# Patient Record
Sex: Male | Born: 1935 | Race: White | Hispanic: No | Smoking: Former smoker
Health system: Southern US, Community
[De-identification: ages and names within clinical notes are randomized; demographics above are authoritative.]

## PROBLEM LIST (undated history)

## (undated) DIAGNOSIS — G3184 Mild cognitive impairment, so stated: Secondary | ICD-10-CM

## (undated) DIAGNOSIS — E0590 Thyrotoxicosis, unspecified without thyrotoxic crisis or storm: Secondary | ICD-10-CM

## (undated) DIAGNOSIS — I10 Essential (primary) hypertension: Secondary | ICD-10-CM

## (undated) HISTORY — DX: Essential (primary) hypertension: I10

## (undated) HISTORY — DX: Mild cognitive impairment of uncertain or unknown etiology: G31.84

## (undated) HISTORY — PX: ABDOMINAL AORTIC ANEURYSM REPAIR: SUR1152

## (undated) HISTORY — DX: Thyrotoxicosis, unspecified without thyrotoxic crisis or storm: E05.90

---

## 2017-12-10 DIAGNOSIS — E0590 Thyrotoxicosis, unspecified without thyrotoxic crisis or storm: Secondary | ICD-10-CM | POA: Insufficient documentation

## 2017-12-20 ENCOUNTER — Ambulatory Visit: Payer: Medicare PPO | Admitting: Podiatry

## 2017-12-24 ENCOUNTER — Ambulatory Visit: Payer: Medicare PPO | Admitting: Podiatry

## 2017-12-24 DIAGNOSIS — M79674 Pain in right toe(s): Secondary | ICD-10-CM

## 2017-12-24 DIAGNOSIS — M79675 Pain in left toe(s): Secondary | ICD-10-CM | POA: Diagnosis not present

## 2017-12-24 DIAGNOSIS — B351 Tinea unguium: Secondary | ICD-10-CM | POA: Diagnosis not present

## 2017-12-24 NOTE — Patient Instructions (Signed)

## 2017-12-25 ENCOUNTER — Telehealth: Payer: Self-pay | Admitting: *Deleted

## 2017-12-25 NOTE — Telephone Encounter (Signed)
[facility] - Dedra SkeensGwen states she will fax the medication list to [phone].

## 2017-12-25 NOTE — Telephone Encounter (Signed)
-----   Message from Freddie Breech, DPM sent at 12/24/2017  5:00 PM EST ----- Regarding: MARS Needed on Dr. Alben Spittle ([facility]) Val,   Dr. Alben Spittle is a [facility] patient who lives at [facility] who arrived with no MARS. His daughter didn't know any information.  If we can get the MARS from [facility] or [facility] so I can have Darrick put his medications in, that would be great. I will refer him back to [facility] for Podiatry for their routine Podiatry visits. He will be eligible again starting mid-January 2020.  Thanks! Dr. Reece Agar.

## 2017-12-25 NOTE — Telephone Encounter (Signed)
[facility] - Candee Furbish states she will fax the MARs to [phone].

## 2018-01-07 ENCOUNTER — Encounter: Payer: Self-pay | Admitting: Podiatry

## 2018-01-07 NOTE — Progress Notes (Signed)
Subjective: Roger Briggs presents today with painful, thick toenails 1-5 b/l that he cannot cut himself and which interfere with daily activities.  Pain is aggravated when wearing enclosed shoe gear.  [facility] and PCP is Dr. Florentina JennyHenry Briggs. Past Medical History:  Diagnosis Date  . HTN (hypertension)   . Hyperthyroidism   . Mild cognitive impairment    Patient Active Problem List   Diagnosis Date Noted  . Hyperthyroidism 12/10/2017  History reviewed. No pertinent surgical history.  Current Outpatient Medications:  .  acetaminophen (TYLENOL) 325 MG tablet, Take 650 mg by mouth every 6 (six) hours as needed., Disp: , Rfl:  .  Betamethasone Acetate POWD, by Does not apply route., Disp: , Rfl:  .  ibuprofen (ADVIL,MOTRIN) 200 MG tablet, Take 200 mg by mouth every 6 (six) hours as needed., Disp: , Rfl:  .  Ibuprofen-diphenhydrAMINE Cit (ADVIL PM PO), Take by mouth at bedtime., Disp: , Rfl:  .  lisinopril (PRINIVIL,ZESTRIL) 40 MG tablet, Take 40 mg by mouth daily., Disp: , Rfl:  .  Metoprolol Succinate 25 MG CS24, Take 25 mg by mouth 2 (two) times daily., Disp: , Rfl:  .  metroNIDAZOLE (FLAGYL) 10 mg/mL oral suspension, Take 15 mg/kg/day by mouth 3 (three) times daily., Disp: , Rfl:  .  QUEtiapine (SEROQUEL) 12.5 mg TABS tablet, Take 12.5 mg by mouth at bedtime., Disp: , Rfl:  .  QUEtiapine (SEROQUEL) 25 MG tablet, Take 25 mg by mouth at bedtime., Disp: , Rfl:  .  sertraline (ZOLOFT) 25 MG tablet, Take 25 mg by mouth daily., Disp: , Rfl:  .  traZODone (DESYREL) 50 MG tablet, Take 50 mg by mouth at bedtime., Disp: , Rfl:  .  augmented betamethasone dipropionate (DIPROLENE-AF) 0.05 % cream, ONE application TWICE DAILY topically AS NEEDED itching/ eczema, Disp: , Rfl: 3 .  methimazole (TAPAZOLE) 10 MG tablet, Take 10 mg by mouth daily., Disp: , Rfl:  .  metoprolol tartrate (LOPRESSOR) 25 MG tablet, Take by mouth., Disp: , Rfl:    No Known Allergies   Social  History   Occupational History  . Occupation: OB Gyn    Comment: retired  Tobacco Use  . Smoking status: Current Every Day Smoker    Types: Pipe  . Smokeless tobacco: Never Used  Substance and Sexual Activity  . Alcohol use: Not Currently  . Drug use: Never  . Sexual activity: Not on file   Family History  Problem Relation Age of Onset  . Hypertension Daughter   . Depression Daughter    Objective: Vascular Examination: Capillary refill time immediate x 10 digits Dorsalis pedis and Posterior tibial pulses faintly palpable b/l Digital hair x 10 digits absent b/l Skin temperature gradient WNL b/l  Dermatological Examination: Skin with age related thinning and atrophy b/l  Toenails 1-5 b/l discolored, thick, dystrophic with subungual debris and pain with palpation to nailbeds due to thickness of nails.  Musculoskeletal: Muscle strength 5/5 to all LE muscle groups  Neurological: Sensation intact with 10 gram monofilament. Vibratory sensation intact.  Assessment: Painful onychomycosis toenails 1-5 b/l   Plan: 1. Toenails 1-5 b/l were debrided in length and girth without iatrogenic bleeding. 2. Patient to continue soft, supportive shoe gear 3. Patient to report any pedal injuries to medical professional immediately. 4. Patient/POA to call should there be a concern in the interim. 5. [facility] and I recommended Dr. Alben SpittleWeaver be added to their next scheduled Podiatry visit.

## 2018-01-08 ENCOUNTER — Telehealth: Payer: Self-pay | Admitting: Podiatry

## 2018-01-08 NOTE — Telephone Encounter (Signed)
Rodney Boozeasha with [facility] called following up on medical records request. Requesting notes be faxed to [phone] and my number is [phone].

## 2019-01-06 ENCOUNTER — Emergency Department (HOSPITAL_COMMUNITY): Payer: Medicare PPO

## 2019-01-06 ENCOUNTER — Emergency Department (HOSPITAL_COMMUNITY)
Admission: EM | Admit: 2019-01-06 | Discharge: 2019-01-06 | Disposition: A | Discharge status: Skilled Nursing Facility | Payer: Medicare PPO | Attending: Emergency Medicine | Admitting: Emergency Medicine

## 2019-01-06 ENCOUNTER — Other Ambulatory Visit: Payer: Self-pay

## 2019-01-06 ENCOUNTER — Encounter (HOSPITAL_COMMUNITY): Payer: Self-pay

## 2019-01-06 DIAGNOSIS — Z79899 Other long term (current) drug therapy: Secondary | ICD-10-CM | POA: Diagnosis not present

## 2019-01-06 DIAGNOSIS — I10 Essential (primary) hypertension: Secondary | ICD-10-CM | POA: Insufficient documentation

## 2019-01-06 DIAGNOSIS — W19XXXA Unspecified fall, initial encounter: Secondary | ICD-10-CM | POA: Insufficient documentation

## 2019-01-06 DIAGNOSIS — F17290 Nicotine dependence, other tobacco product, uncomplicated: Secondary | ICD-10-CM | POA: Diagnosis not present

## 2019-01-06 DIAGNOSIS — F0390 Unspecified dementia without behavioral disturbance: Secondary | ICD-10-CM | POA: Insufficient documentation

## 2019-01-06 DIAGNOSIS — R519 Headache, unspecified: Secondary | ICD-10-CM | POA: Diagnosis present

## 2019-01-06 NOTE — ED Triage Notes (Signed)
Patient arrived via [place] after an unwitnessed fall in the bathroom. Pt states this is the second fall today, hx of same. Patient denies pain, hx: dementia. Ambulatory with EMS. No blood thinners noted.

## 2019-01-06 NOTE — Discharge Instructions (Addendum)
We saw you in the ER after you had a fall. °All the imaging results are normal, no fractures seen. No evidence of brain bleed. °Please be very careful with walking, and do everything possible to prevent falls. ° ° °

## 2019-01-06 NOTE — ED Notes (Signed)
Called report to [employer].

## 2019-01-07 NOTE — ED Provider Notes (Signed)
[facility] DEPT Provider Note   CSN: [id number] Arrival date & time: 01/06/19  1705     History   Chief Complaint Chief Complaint  Patient presents with  . Fall    HPI Roger Briggs is a 85 y.o. male.     HPI 85 year old male comes in a chief complaint of fall  Level 5 caveat for dementia.  According [facility] had an unwitnessed fall in the bathroom.  Patient was ambulatory per EMS.  He is not on any blood thinners.  He has no complaints from his side.   Past Medical History:  Diagnosis Date  . HTN (hypertension)   . Hyperthyroidism   . Mild cognitive impairment     Patient Active Problem List   Diagnosis Date Noted  . Hyperthyroidism 12/10/2017    History reviewed. No pertinent surgical history.      Home Medications    Prior to Admission medications   Medication Sig Start Date End Date Taking? Authorizing Provider  acetaminophen (TYLENOL) 325 MG tablet Take 650 mg by mouth every 6 (six) hours as needed.   Yes [provider]  augmented betamethasone dipropionate (DIPROLENE-AF) 0.05 % cream Apply 1 application topically 2 (two) times daily as needed (itching).  12/12/17  Yes [provider]  ibuprofen (ADVIL,MOTRIN) 200 MG tablet Take 200 mg by mouth every 6 (six) hours as needed.   Yes [provider]  lisinopril (PRINIVIL,ZESTRIL) 40 MG tablet Take 40 mg by mouth daily.   Yes [provider]  LORazepam (ATIVAN) 0.5 MG tablet Take 0.5 mg by mouth daily as needed (agitation).   Yes [provider]  metoprolol tartrate (LOPRESSOR) 25 MG tablet Take 25 mg by mouth 2 (two) times daily.    Yes [provider]  traZODone (DESYREL) 50 MG tablet Take 25 mg by mouth at bedtime.    Yes [provider]    Family History Family History  Problem Relation Age of Onset  . Hypertension Daughter   . Depression Daughter     Social  History Social History   Tobacco Use  . Smoking status: Current Every Day Smoker    Types: Pipe  . Smokeless tobacco: Never Used  Substance Use Topics  . Alcohol use: Not Currently  . Drug use: Never     Allergies   Patient has no known allergies.   Review of Systems Review of Systems  Unable to perform ROS: Mental status change     Physical Exam Updated Vital Signs BP (!) 168/51 (BP Location: Right Arm)   Pulse 97   Temp 98.7 F (37.1 C) (Oral)   Resp 19   Ht 6\' 1"  (1.854 m)   Wt 86.2 kg   SpO2 100%   BMI 25.07 kg/m   Physical Exam Vitals signs and nursing note reviewed.  Constitutional:      Appearance: He is well-developed.  HENT:     Head: Atraumatic.  Neck:     Musculoskeletal: Neck supple.  Cardiovascular:     Rate and Rhythm: Normal rate.  Pulmonary:     Effort: Pulmonary effort is normal.  Musculoskeletal:        General: No deformity.  Skin:    General: Skin is warm.  Neurological:     Mental Status: He is alert and oriented to person, place, and time.      ED Treatments / Results  Labs (all labs ordered are listed, but only  abnormal results are displayed) Labs Reviewed - No data to display  EKG None  Radiology Ct Head Wo Contrast  Result Date: 01/06/2019 CLINICAL DATA:  Acute pain due to trauma. EXAM: CT HEAD WITHOUT CONTRAST CT CERVICAL SPINE WITHOUT CONTRAST TECHNIQUE: Multidetector CT imaging of the head and cervical spine was performed following the standard protocol without intravenous contrast. Multiplanar CT image reconstructions of the cervical spine were also generated. COMPARISON:  None. FINDINGS: CT HEAD FINDINGS Brain: No evidence of acute infarction, hemorrhage, hydrocephalus, extra-axial collection or mass lesion/mass effect. There is advanced age related atrophy and chronic microvascular ischemic changes. 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 Vascular: No hyperdense vessel or unexpected calcification. Skull:  Normal. Negative for fracture or focal lesion. Sinuses/Orbits: No acute finding. Other: None. CT CERVICAL SPINE FINDINGS Alignment: Normal. Skull base and vertebrae: No acute fracture. No primary bone lesion or focal pathologic process. Soft tissues and spinal canal: No prevertebral fluid or swelling. No visible canal hematoma. Disc levels: Multilevel disc height loss is noted throughout the cervical spine greatest at the C5-C6 and C6-C7 levels. Upper chest: Negative. Other: None IMPRESSION: 1. No acute intracranial abnormality. 2. Advanced age related atrophy and chronic microvascular ischemic changes of the brain. 3. No evidence for acute fracture or subluxation of the cervical spine. 4. Multilevel degenerative disc disease of the cervical spine greatest at the C5-C6 and C6-C7 levels. Electronically Signed   By: Katherine Mantlehristopher  Green M.D.   On: 01/06/2019 18:53   Ct Cervical Spine Wo Contrast  Result Date: 01/06/2019 CLINICAL DATA:  Acute pain due to trauma. EXAM: CT HEAD WITHOUT CONTRAST CT CERVICAL SPINE WITHOUT CONTRAST TECHNIQUE: Multidetector CT imaging of the head and cervical spine was performed following the standard protocol without intravenous contrast. Multiplanar CT image reconstructions of the cervical spine were also generated. COMPARISON:  None. FINDINGS: CT HEAD FINDINGS Brain: No evidence of acute infarction, hemorrhage, hydrocephalus, extra-axial collection or mass lesion/mass effect. There is advanced age related atrophy and chronic microvascular ischemic changes. 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 Vascular: No hyperdense vessel or unexpected calcification. Skull: Normal. Negative for fracture or focal lesion. Sinuses/Orbits: No acute finding. Other: None. CT CERVICAL SPINE FINDINGS Alignment: Normal. Skull base and vertebrae: No acute fracture. No primary bone lesion or focal pathologic process. Soft tissues and spinal canal: No prevertebral fluid or swelling. No visible canal  hematoma. Disc levels: Multilevel disc height loss is noted throughout the cervical spine greatest at the C5-C6 and C6-C7 levels. Upper chest: Negative. Other: None IMPRESSION: 1. No acute intracranial abnormality. 2. Advanced age related atrophy and chronic microvascular ischemic changes of the brain. 3. No evidence for acute fracture or subluxation of the cervical spine. 4. Multilevel degenerative disc disease of the cervical spine greatest at the C5-C6 and C6-C7 levels. Electronically Signed   By: Katherine Mantlehristopher  Green M.D.   On: 01/06/2019 18:53   Dg Chest Port 1 View  Result Date: 01/06/2019 CLINICAL DATA:  Fall.  Dementia. EXAM: PORTABLE CHEST 1 VIEW COMPARISON:  None. FINDINGS: Patient rotated minimally left. Moderate cardiomegaly. Prominent transverse aorta. No pleural effusion or pneumothorax. Bibasilar scarring. No congestive failure. IMPRESSION: No acute or posttraumatic deformity identified. Bibasilar scarring. Electronically Signed   By: Jeronimo GreavesKyle  Talbot M.D.   On: 01/06/2019 18:05    Procedures Procedures (including critical care time)  Medications Ordered in ED Medications - No data to display   Initial Impression / Assessment and Plan / ED Course  I have reviewed the triage vital signs  and the nursing notes.  Pertinent labs & imaging results that were available during my care of the patient were reviewed by me and considered in my medical decision making (see chart for details).        DDx includes: - Mechanical falls - ICH - Fractures - Contusions - Soft tissue injury  Patient comes in with chief complaint of fall.  CT head and C-spine ordered as we cannot clear his head and C-spine clinically because of his severe dementia.  Results of the imaging in the ED is negative.  Patient will be discharged to the facility.    Final Clinical Impressions(s) / ED Diagnoses   Final diagnoses:  Fall in home, initial encounter    ED Discharge Orders    None       Derwood Kaplan, MD 01/07/19 2338

## 2019-09-10 ENCOUNTER — Ambulatory Visit: Payer: Medicare Other | Admitting: Cardiovascular Disease

## 2019-09-10 ENCOUNTER — Telehealth: Payer: Self-pay | Admitting: Cardiovascular Disease

## 2019-09-10 ENCOUNTER — Other Ambulatory Visit: Payer: Self-pay

## 2019-09-10 ENCOUNTER — Encounter: Payer: Self-pay | Admitting: Cardiovascular Disease

## 2019-09-10 VITALS — BP 158/85 | HR 56 | Ht 73.0 in | Wt 198.6 lb

## 2019-09-10 DIAGNOSIS — I10 Essential (primary) hypertension: Secondary | ICD-10-CM

## 2019-09-10 DIAGNOSIS — R0602 Shortness of breath: Secondary | ICD-10-CM

## 2019-09-10 DIAGNOSIS — I4811 Longstanding persistent atrial fibrillation: Secondary | ICD-10-CM

## 2019-09-10 DIAGNOSIS — R001 Bradycardia, unspecified: Secondary | ICD-10-CM

## 2019-09-10 NOTE — Patient Instructions (Addendum)
Medication Instructions:  The current medical regimen is effective;  continue present plan and medications.  *If you need a refill on your cardiac medications before your next appointment, please call your pharmacy*   Lab Work: TSH, CBC, BMET, BNP today  If you have labs (blood work) drawn today and your tests are completely normal, you will receive your results only by: Marland Kitchen MyChart Message (if you have MyChart) OR . A paper copy in the mail If you have any lab test that is abnormal or we need to change your treatment, we will call you to review the results.   Testing/Procedures: Echocardiogram - Your physician has requested that you have an echocardiogram. Echocardiography is a painless test that uses sound waves to create images of your heart. It provides your doctor with information about the size and shape of your heart and how well your heart's chambers and valves are working. This procedure takes approximately one hour. There are no restrictions for this procedure. This will be performed at our [facility] location - [address].    Follow-Up: At [facility], you and your health needs are our priority.  As part of our continuing mission to provide you with exceptional heart care, we have created designated Provider Care Teams.  These Care Teams include your primary Cardiologist (physician) and Advanced Practice Providers (APPs -  Physician Assistants and Nurse Practitioners) who all work together to provide you with the care you need, when you need it.  We recommend signing up for the patient portal called "MyChart".  Sign up information is provided on this After Visit Summary.  MyChart is used to connect with patients for Virtual Visits (Telemedicine).  Patients are able to view lab/test results, encounter notes, upcoming appointments, etc.  Non-urgent messages can be sent to your provider as well.   To learn more about what you can do with MyChart, go to  [email or web address].    Your next appointment:   2 month(s)  The format for your next appointment:   In Person  Provider:   Lennie Odor, MD   Dr.O'Neal's Nurse: Roger Fanning, LPN- please have them e-mail records to me (secure e-mail) [email or web address]  Or fax records to [phone]

## 2019-09-10 NOTE — Progress Notes (Signed)
Cardiology Office Note:   Date:  09/10/2019  NAME:  Roger Briggs    MRN: [id number] DOB:  07-Nov-1929   PCP:  Patient, No Pcp Per  Cardiologist:  No primary care provider on file.   Referring MD: [facility]*   Chief Complaint  Patient presents with  . Bradycardia    History of Present Illness:   Roger Briggs is a 86 y.o. male with a hx of dementia, hyper thyroidism on methimazole, hypertension who is being seen today for the evaluation of bradycardia/atrial fibrillation at the request of [facility], Doctors [facility]*.  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  [name].  He apparently has a history of a AAA status post repair a few years ago.  He has no prior cardiac history per his report or his daughter's report.  He does suffer from dementia and frequent falls.  Apparently he had a fall in July 2018 and he suffered a subdural hematoma.  He apparently has been in assisted living since that time.  He also has a working diagnosis of vascular dementia or Alzheimer's.  He apparently still is somewhat with it.  He does require assistance with bathing and dressing.  He also has been diagnosed with recurrent pneumonia from what I can tell over the past few weeks from his daughter's report.  He did have a chest x-ray that was done last week but we do not have the results of that data.  He has been known to have a slow heart rate and apparently an EKG was checked at the facility he lives.  He was noted to be in atrial fibrillation and sent for evaluation.  He has no prior history of atrial fibrillation.  His EKG today demonstrates A. fib with heart rate of 56.  He is on metoprolol for rate control.  He is not on anticoagulation.  No prior history of heart attack or stroke.  He is not that active and requires assistance with long walks.  Despite this he is able to use a walker and denies  any chest pain or shortness of breath.  His daughter reports at times he is a little short of breath.  He is a pipe smoker.  Family history is not contributory.  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  He does have hypothyroidism but has not had his TSH checked due to the coronavirus pandemic.  Problem List 1. Hyperthyroidism on methimazole 2. Dementia 3. HTN 4. AAA s/p repair  5. Falls/Subdural hematoma   Past Medical History: Past Medical History:  Diagnosis Date  . HTN (hypertension)   . Hyperthyroidism   . Mild cognitive impairment     Past Surgical History: Past Surgical History:  Procedure Laterality Date  . ABDOMINAL AORTIC ANEURYSM REPAIR      Current Medications: Current Meds  Medication Sig  . acetaminophen (TYLENOL) 325 MG tablet Take 650 mg by mouth every 6 (six) hours as needed.  Marland Kitchen augmented betamethasone dipropionate (DIPROLENE-AF) 0.05 % cream Apply 1 application topically 2 (two) times daily as needed (itching).   Marland Kitchen doxycycline (VIBRA-TABS) 100 MG tablet Take 100 mg by mouth 2 (two) times daily.  . Emollient (CERAVE DAILY MOISTURIZING) LOTN apply to face as needed for irritation BID Externally 30 day(s)  . ibuprofen (ADVIL,MOTRIN) 200 MG tablet Take 200 mg by mouth every 6 (six) hours as  needed.  Marland Kitchen lisinopril (PRINIVIL,ZESTRIL) 40 MG tablet Take 40 mg by mouth daily.  Marland Kitchen loratadine (CLARITIN) 10 MG tablet 1 tablet Once a day Orally 30 day(s)  . LORazepam (ATIVAN) 0.5 MG tablet Take 0.5 mg by mouth daily as needed (agitation).  . methimazole (TAPAZOLE) 5 MG tablet Take 5 mg by mouth daily.  . metoprolol tartrate (LOPRESSOR) 25 MG tablet Take 25 mg by mouth 2 (two) times daily.   . traZODone (DESYREL) 50 MG tablet Take 25 mg by mouth at bedtime.      Allergies:    Patient has no known allergies.   Social History: Social History   Socioeconomic History  . Marital status: Widowed    Spouse name: Not on file  . Number of children: 5  . Years of  education: Not on file  . Highest education level: Not on file  Occupational History  . Occupation: OB Gyn    Comment: retired  Tobacco Use  . Smoking status: Current Every Day Smoker    Types: Pipe  . Smokeless tobacco: Never Used  Substance and Sexual Activity  . Alcohol use: Not Currently  . Drug use: Never  . Sexual activity: Not on file  Other Topics Concern  . Not on file  Social History Narrative  . Not on file   Social Determinants of Health   Financial Resource Strain:   . Difficulty of Paying Living Expenses:   Food Insecurity:   . Worried About Programme researcher, broadcasting/film/video in the Last Year:   . Barista in the Last Year:   Transportation Needs:   . Freight forwarder (Medical):   Marland Kitchen Lack of Transportation (Non-Medical):   Physical Activity:   . Days of Exercise per Week:   . Minutes of Exercise per Session:   Stress:   . Feeling of Stress :   Social Connections:   . Frequency of Communication with Friends and Family:   . Frequency of Social Gatherings with Friends and Family:   . Attends Religious Services:   . Active Member of Clubs or Organizations:   . Attends Banker Meetings:   Marland Kitchen Marital Status:      Family History: The patient's family history includes Depression in his daughter; Hypertension in his daughter.  ROS:   All other ROS reviewed and negative. Pertinent positives noted in the HPI.     EKGs/Labs/Other Studies Reviewed:   The following studies were personally reviewed by me today:  EKG:  EKG is ordered today.  The ekg ordered today demonstrates atrial fibrillation heart rate 56, no acute ST-T changes, no evidence for infarct, and was personally reviewed by me.   Recent Labs: No results found for requested labs within last 8760 hours.   Recent Lipid Panel No results found for: CHOL, TRIG, HDL, CHOLHDL, VLDL, LDLCALC, LDLDIRECT  Physical Exam:   VS:  BP (!) 158/85   Pulse 56   Ht 6\' 1"  (1.854 m)   Wt 198 lb 9.6 oz  (90.1 kg)   SpO2 97%   BMI 26.20 kg/m    Wt Readings from Last 3 Encounters:  09/10/19 198 lb 9.6 oz (90.1 kg)  01/06/19 190 lb (86.2 kg)    General: Well nourished, well developed, in no acute distress Heart: Atraumatic, normal size  Eyes: PEERLA, EOMI  Neck: Supple, no JVD Endocrine: No thryomegaly Cardiac: Normal S1, S2; irregular rhythm  Lungs: Crackles at the right lung base Abd: Soft, nontender, no hepatomegaly  Ext: 1+ lower extremity edema Musculoskeletal: No deformities, BUE and BLE strength normal and equal Skin: Warm and dry, no rashes   Neuro: Alert and oriented to person, place, time, and situation, CNII-XII grossly intact, no focal deficits  Psych: Normal mood and affect   ASSESSMENT:   Roger Briggs is a 86 y.o. male who presents for the following: 1. Bradycardia   2. Longstanding persistent atrial fibrillation (HCC)   3. Essential hypertension   4. SOB (shortness of breath)     PLAN:   1. Bradycardia 2. Longstanding persistent atrial fibrillation (HCC) 3. Essential hypertension 4. SOB (shortness of breath) -New onset A. fib from what I can tell.  No prior history of this. CHADSVASC=4 (age, PAD, HTN).  He is asymptomatic from this.  He is rate controlled on metoprolol.  We will continue this.  Anticoagulation is recommended however given his history of fall and subdural hematoma this could be problematic.  He also has continued falls per report of the daughter.  I did discuss the risk of bleeding associated with anticoagulation and the risk of stroke being not anticoagulated.  I have asked the daughter to discuss with the other daughters given his dementia and have them weigh in on if they would like to proceed with anticoagulation.  If he does proceed with anticoagulation we will have to have neurology's approval given the history of traumatic subdural hematoma.  It may be okay to proceed but I am reluctant to do this in this elderly gentleman with dementia who has  falls and a subdural hematoma in the past. -He reports he has been short of breath for the past few weeks and had recurrent pneumonia.  He has crackles in the right lung base.  He does have 1+ lower extremity edema.  I would like for him to get full labs today including a TSH, CBC, BMP, BNP.  I also want him to get an echocardiogram to ensure his heart structurally normal.  I hear no murmurs but we need to make sure his heart function is normal.  It is unclear what came first possible decompensated heart failure that led to A. fib or vice versa.  He also has a history of hypothyroidism and we need to make sure his thyroid state is not contributing to his atrial fibrillation.  There is no recent weight loss but they do report weight gain.  I am the concern for volume.  We will hold on Lasix for now I will follow-up the results of the BNP and echocardiogram before we proceed with further treatments.  I would also like for them to have the recent chest x-ray results sent to [place] so we can review it.  I will see him back in 2 months after the above testing. -I have asked the daughter to go ahead and begin to have goals of care discussion.  We need to set goals for how aggressive we will be.  It appears that they are focused on a less aggressive path at this point.   Disposition: Return in about 2 months (around 11/11/2019).  Medication Adjustments/Labs and Tests Ordered: Current medicines are reviewed at length with the patient today.  Concerns regarding medicines are outlined above.  Orders Placed This Encounter  Procedures  . TSH  . CBC  . Basic metabolic panel  . Brain natriuretic peptide  . ECHOCARDIOGRAM COMPLETE   No orders of the defined types were placed in this encounter.   Patient Instructions  Medication Instructions:  The current medical regimen is effective;  continue present plan and medications.  *If you need a refill on your cardiac medications before your next appointment, please  call your pharmacy*   Lab Work: TSH, CBC, BMET, BNP today  If you have labs (blood work) drawn today and your tests are completely normal, you will receive your results only by: Marland Kitchen. MyChart Message (if you have MyChart) OR . A paper copy in the mail If you have any lab test that is abnormal or we need to change your treatment, we will call you to review the results.   Testing/Procedures: Echocardiogram - Your physician has requested that you have an echocardiogram. Echocardiography is a painless test that uses sound waves to create images of your heart. It provides your doctor with information about the size and shape of your heart and how well your heart's chambers and valves are working. This procedure takes approximately one hour. There are no restrictions for this procedure. [facility] - [address], [address].    Follow-Up: At [facility], you and your health needs are our priority.  As part of our continuing mission to provide you with exceptional heart care, we have created designated Provider Care Teams.  These Care Teams include your primary Cardiologist (physician) and Advanced Practice Providers (APPs -  Physician Assistants and Nurse Practitioners) who all work together to provide you with the care you need, when you need it.  We recommend signing up for the patient portal called "MyChart".  Sign up information is provided on this After Visit Summary.  MyChart is used to connect with patients for Virtual Visits (Telemedicine).  Patients are able to view lab/test results, encounter notes, upcoming appointments, etc.  Non-urgent messages can be sent to your provider as well.   To learn more about what you can do with MyChart, go to [email or web address].    Your next appointment:   2 month(s)  The format for your next appointment:   In Person  Provider:   Lennie OdorWesley O'Neal, MD   Dr.O'Neal's Nurse: Raynelle FanningJulie, LPN- please have them e-mail  records to me (secure e-mail) [email or web address]  Or fax records to [phone]       Signed, Lenna GilfordWesley T. Flora Lipps'Neal, MD [facility]  [facility]  [address], [address] [place], [place] [address] [phone]  09/10/2019 5:08 PM

## 2019-09-10 NOTE — Telephone Encounter (Signed)
New Message  New Patient appt scheduled with Dr. Flora Lipps at 1:40 pm today with Julie's permission.

## 2019-09-11 LAB — CBC
Hematocrit: 36.1 % — ABNORMAL LOW (ref 37.5–51.0)
Hemoglobin: 11.9 g/dL — ABNORMAL LOW (ref 13.0–17.7)
MCH: 30.4 pg (ref 26.6–33.0)
MCHC: 33 g/dL (ref 31.5–35.7)
MCV: 92 fL (ref 79–97)
Platelets: 128 10*3/uL — ABNORMAL LOW (ref 150–450)
RBC: 3.91 x10E6/uL — ABNORMAL LOW (ref 4.14–5.80)
RDW: 13.2 % (ref 11.6–15.4)
WBC: 4.7 10*3/uL (ref 3.4–10.8)

## 2019-09-11 LAB — BRAIN NATRIURETIC PEPTIDE: BNP: 280.1 pg/mL — ABNORMAL HIGH (ref 0.0–100.0)

## 2019-09-11 LAB — BASIC METABOLIC PANEL
BUN/Creatinine Ratio: 19 (ref 10–24)
BUN: 23 mg/dL (ref 8–27)
CO2: 26 mmol/L (ref 20–29)
Calcium: 10.5 mg/dL — ABNORMAL HIGH (ref 8.6–10.2)
Chloride: 105 mmol/L (ref 96–106)
Creatinine, Ser: 1.19 mg/dL (ref 0.76–1.27)
GFR calc Af Amer: 63 mL/min/{1.73_m2} (ref >59)
GFR calc non Af Amer: 55 mL/min/{1.73_m2} — ABNORMAL LOW (ref >59)
Glucose: 94 mg/dL (ref 65–99)
Potassium: 4.8 mmol/L (ref 3.5–5.2)
Sodium: 143 mmol/L (ref 134–144)

## 2019-09-11 LAB — TSH: TSH: 2.71 u[IU]/mL (ref 0.450–4.500)

## 2019-09-18 ENCOUNTER — Telehealth: Payer: Self-pay | Admitting: Cardiovascular Disease

## 2019-09-18 ENCOUNTER — Other Ambulatory Visit: Payer: Self-pay

## 2019-09-18 MED ORDER — FUROSEMIDE 20 MG PO TABS
20.0000 mg | ORAL_TABLET | Freq: Every day | ORAL | 3 refills | Status: DC
Start: 2019-09-18 — End: 2019-11-06

## 2019-09-18 NOTE — Telephone Encounter (Signed)
Patient's daughter returning call for lab results. °

## 2019-09-18 NOTE — Telephone Encounter (Signed)
Called daughter back-  Gave lab results. RX sent to pharmacy.

## 2019-09-22 NOTE — Addendum Note (Signed)
Addended by: Lexie Koehl C on: 09/22/2019 11:19 AM   Modules accepted: Orders  

## 2019-09-23 ENCOUNTER — Other Ambulatory Visit: Payer: Self-pay

## 2019-09-23 ENCOUNTER — Ambulatory Visit (HOSPITAL_COMMUNITY): Payer: Medicare Other | Attending: Cardiology

## 2019-09-23 DIAGNOSIS — I4811 Longstanding persistent atrial fibrillation: Secondary | ICD-10-CM | POA: Insufficient documentation

## 2019-09-23 LAB — ECHOCARDIOGRAM COMPLETE
P 1/2 time: 496 msec
S' Lateral: 3.05 cm

## 2019-09-26 ENCOUNTER — Telehealth: Payer: Self-pay | Admitting: Cardiovascular Disease

## 2019-09-26 NOTE — Telephone Encounter (Signed)
Patient's daughter returned call for echo results.

## 2019-09-26 NOTE — Telephone Encounter (Signed)
Called patient daughter.  Gave results.  Patient daughter verbalized understanding.

## 2019-10-16 ENCOUNTER — Ambulatory Visit: Payer: Medicare Other | Admitting: Cardiology

## 2019-11-05 NOTE — Progress Notes (Signed)
Cardiology Office Note:   Date:  11/06/2019  NAME:  Roger Briggs    MRN: [id number] DOB:  [birth year]   PCP:  Pcp, No  Cardiologist:  No primary care provider on file.   Referring MD: No ref. provider found   Chief Complaint  Patient presents with   Atrial Fibrillation   History of Present Illness:   Roger Briggs is a 86 y.o. male with a hx of atrial fib, hyperthyroidism on methimazole, HTN, dementia, falls/SDH, HFpEF who presents for follow-up. Recently evaluated for atrial fibrillation. Rate controlled on metoprolol. Due to falls and SDH we did not start AC. Had some LE edema and started on lasix. BNP 280.   He reports he is doing well on Lasix.  Lower extremity edema has improved.  His metoprolol was reduced to 25 mg twice daily due to low heart rate.  No dizziness reported.  He is a walker when he is in his assisted living facility.  He is at [facility].  We did readdress the anticoagulation issue with his daughter today in the office.  She discussed this with her other family members and they have decided to hold anticoagulation this time.  Given his fall risk and subdural hematoma in the past do not want to expose him to any unnecessary bleeding risk.  I think this is reasonable.  He also has progression of his dementia.  They are working with physical therapy and speech therapy to improve his overall quality of life.  Overall things appear to be status quo.  Improvement in lower extreme edema with Lasix noted.  Problem List 1. Hyperthyroidism on methimazole 2. Dementia 3. HTN 4. AAA s/p repair  5. Falls/Subdural hematoma  6. Atrial fibrillation, persistent  7. HFpEF, 60-65% -BNP 280  Past Medical History: Past Medical History:  Diagnosis Date   HTN (hypertension)    Hyperthyroidism    Mild cognitive impairment     Past Surgical History: Past Surgical History:  Procedure Laterality Date   ABDOMINAL AORTIC ANEURYSM REPAIR      Current Medications: Current  Meds  Medication Sig   acetaminophen (TYLENOL) 325 MG tablet Take 650 mg by mouth every 6 (six) hours as needed.   augmented betamethasone dipropionate (DIPROLENE-AF) 0.05 % cream Apply 1 application topically 2 (two) times daily as needed (itching).    Emollient (CERAVE DAILY MOISTURIZING) LOTN apply to face as needed for irritation BID Externally 30 day(s)   Ferrous Fumarate (HEMOCYTE - 106 MG FE) 324 (106 Fe) MG TABS tablet Take 1 tablet by mouth daily.   furosemide (LASIX) 20 MG tablet Take 1 tablet (20 mg total) by mouth every other day.   ibuprofen (ADVIL,MOTRIN) 200 MG tablet Take 200 mg by mouth every 6 (six) hours as needed.   lisinopril (PRINIVIL,ZESTRIL) 40 MG tablet Take 40 mg by mouth daily.   loratadine (CLARITIN) 10 MG tablet 1 tablet Once a day Orally 30 day(s)   LORazepam (ATIVAN) 0.5 MG tablet Take 0.5 mg by mouth daily as needed (agitation).   methimazole (TAPAZOLE) 5 MG tablet Take 5 mg by mouth daily.   metoprolol tartrate (LOPRESSOR) 25 MG tablet Take 25 mg by mouth 2 (two) times daily.    traZODone (DESYREL) 50 MG tablet Take 25 mg by mouth at bedtime.    [DISCONTINUED] furosemide (LASIX) 20 MG tablet Take 1 tablet (20 mg total) by mouth daily.     Allergies:    Patient has no known allergies.   Social History: Social History  Socioeconomic History   Marital status: Widowed    Spouse name: Not on file   Number of children: 5   Years of education: Not on file   Highest education level: Not on file  Occupational History   Occupation: OB Gyn    Comment: retired  Tobacco Use   Smoking status: Current Every Day Smoker    Types: Pipe   Smokeless tobacco: Never Used  Substance and Sexual Activity   Alcohol use: Not Currently   Drug use: Never   Sexual activity: Not on file  Other Topics Concern   Not on file  Social History Narrative   Not on file   Social Determinants of Health   Financial Resource Strain:    Difficulty of  Paying Living Expenses: Not on file  Food Insecurity:    Worried About Programme researcher, broadcasting/film/videounning Out of Food in the Last Year: Not on file   [employer] Out of Food in the Last Year: Not on file  Transportation Needs:    Lack of Transportation (Medical): Not on file   Lack of Transportation (Non-Medical): Not on file  Physical Activity:    Days of Exercise per Week: Not on file   Minutes of Exercise per Session: Not on file  Stress:    Feeling of Stress : Not on file  Social Connections:    Frequency of Communication with Friends and Family: Not on file   Frequency of Social Gatherings with Friends and Family: Not on file   Attends Religious Services: Not on file   Active Member of Clubs or Organizations: Not on file   Attends BankerClub or Organization Meetings: Not on file   Marital Status: Not on file     Family History: The patient's family history includes Depression in his daughter; Hypertension in his daughter.  ROS:   All other ROS reviewed and negative. Pertinent positives noted in the HPI.     EKGs/Labs/Other Studies Reviewed:   The following studies were personally reviewed by me today:  TTE 09/23/2019 1. Left ventricular ejection fraction, by estimation, is 60 to 65%. The  left ventricle has normal function. The left ventricle has no regional  wall motion abnormalities. There is mild concentric left ventricular  hypertrophy. Left ventricular diastolic  parameters are indeterminate. The average left ventricular global  longitudinal strain is -21.5 %. The global longitudinal strain is normal.  2. Right ventricular systolic function is mildly reduced. The right  ventricular size is moderately enlarged. There is normal pulmonary artery  systolic pressure.  3. Right atrial size was mildly dilated.  4. Left atrial size was severely dilated.  5. The mitral valve is normal in structure. Mild mitral valve  regurgitation. No evidence of mitral stenosis.  6. The aortic valve is  tricuspid. Aortic valve regurgitation is trivial.  Moderate aortic valve sclerosis/calcification is present, without any  evidence of aortic stenosis.  7. Aortic dilatation noted. There is mild dilatation of the aortic root  measuring 42 mm.  8. The inferior vena cava is normal in size with greater than 50%  respiratory variability, suggesting right atrial pressure of 3 mmHg.   Recent Labs: 09/10/2019: BNP 280.1; BUN 23; Creatinine, Ser 1.19; Hemoglobin 11.9; Platelets 128; Potassium 4.8; Sodium 143; TSH 2.710   Recent Lipid Panel No results found for: CHOL, TRIG, HDL, CHOLHDL, VLDL, LDLCALC, LDLDIRECT  Physical Exam:   VS:  BP (!) 142/70    Pulse 62    Temp (!) 97 F (36.1 C)  Ht 6' 1.5" (1.867 m)    Wt 199 lb 3.2 oz (90.4 kg)    SpO2 98%    BMI 25.92 kg/m    Wt Readings from Last 3 Encounters:  11/06/19 199 lb 3.2 oz (90.4 kg)  09/10/19 198 lb 9.6 oz (90.1 kg)  01/06/19 190 lb (86.2 kg)    General: Well nourished, well developed, in no acute distress Heart: Atraumatic, normal size  Eyes: PEERLA, EOMI  Neck: Supple, no JVD Endocrine: No thryomegaly Cardiac: Normal S1, S2; irregular rhythm, no murmurs rubs or gallops Lungs: Clear to auscultation bilaterally, no wheezing, rhonchi or rales  Abd: Soft, nontender, no hepatomegaly  Ext: Trace edema Musculoskeletal: No deformities, BUE and BLE strength normal and equal Skin: Warm and dry, no rashes   Neuro: Alert and oriented to person, place, time, and situation, CNII-XII grossly intact, no focal deficits  Psych: Normal mood and affect   ASSESSMENT:   Roger Briggs is a 86 y.o. male who presents for the following: 1. Bradycardia   2. Longstanding persistent atrial fibrillation (HCC)   3. Essential hypertension   4. Chronic diastolic heart failure (HCC)     PLAN:   1. Bradycardia 2. Longstanding persistent atrial fibrillation (HCC) -Recent diagnosis of atrial fibrillation.  Normal thyroid studies.  Recent echocardiogram  with no significant valvular heart disease and preserved ejection fraction.  He did have some evidence of volume overload.  We did start Lasix with improvement in symptoms.  Seems to be doing well. -Would recommend to continue metoprolol tartrate 25 mg twice daily.  If heart rate continues to get lower we can back off on this completely.  He does have advanced dementia and main focus is on comfort. -We had a discussion about anticoagulation.  He does have a history of fall risk and subdural hematoma.  Although anticoagulation is recommended, his bleeding risk is quite high.  After discussion with the family decided to withhold anticoagulation.  Given his advanced dementia I think this is reasonable.  He also is very high fall risk.  He also has very high bleeding risk with history of subdural hematoma.  3. Essential hypertension -Well-controlled today.  No change in medication.  He will continue his metoprolol.  4. Chronic diastolic heart failure (HCC) -He had some lower extreme edema.  We started him on Lasix.  Will back off the Lasix 20 mg every other day.  We will plan to see him every 6 months. -Overall he has advanced dementia and we will take a less is more approach.  He presents with his daughter and they prefer a less aggressive course of medical care.  I think this is quite reasonable.  Disposition: Return in about 6 months (around 05/05/2020).  Medication Adjustments/Labs and Tests Ordered: Current medicines are reviewed at length with the patient today.  Concerns regarding medicines are outlined above.  No orders of the defined types were placed in this encounter.  Meds ordered this encounter  Medications   furosemide (LASIX) 20 MG tablet    Sig: Take 1 tablet (20 mg total) by mouth every other day.    Dispense:  90 tablet    Refill:  3    Patient Instructions  Medication Instructions:  CONTINUE METOPROLOL TARTRATE 25MG  TWICE DAILY TAKE LASIX (FUROSEMIDE) EVERY OTHER DAY *If  you need a refill on your cardiac medications before your next appointment, please call your pharmacy*  Lab Work: None Ordered At This Time.  If you have labs (blood work) drawn  today and your tests are completely normal, you will receive your results only by:  MyChart Message (if you have MyChart) OR  A paper copy in the mail If you have any lab test that is abnormal or we need to change your treatment, we will call you to review the results.  Testing/Procedures: None Ordered At This Time.   Follow-Up: At [facility], you and your health needs are our priority.  As part of our continuing mission to provide you with exceptional heart care, we have created designated Provider Care Teams.  These Care Teams include your primary Cardiologist (physician) and Advanced Practice Providers (APPs -  Physician Assistants and Nurse Practitioners) who all work together to provide you with the care you need, when you need it.  We recommend signing up for the patient portal called "MyChart".  Sign up information is provided on this After Visit Summary.  MyChart is used to connect with patients for Virtual Visits (Telemedicine).  Patients are able to view lab/test results, encounter notes, upcoming appointments, etc.  Non-urgent messages can be sent to your provider as well.   To learn more about what you can do with MyChart, go to [email or web address].    Your next appointment:   6 month(s)  The format for your next appointment:   In Person  Provider:   Lennie Odor, MD    Time Spent with Patient: I have spent a total of 25 minutes with patient reviewing hospital notes, telemetry, EKGs, labs and examining the patient as well as establishing an assessment and plan that was discussed with the patient.  > 50% of time was spent in direct patient care.  Signed, Lenna Gilford. Flora Lipps, MD [facility]  [address] [place], [place] [address] [phone]  11/06/2019 3:09  PM

## 2019-11-06 ENCOUNTER — Other Ambulatory Visit: Payer: Self-pay

## 2019-11-06 ENCOUNTER — Ambulatory Visit: Payer: Medicare Other | Admitting: Cardiovascular Disease

## 2019-11-06 ENCOUNTER — Encounter: Payer: Self-pay | Admitting: Cardiovascular Disease

## 2019-11-06 VITALS — BP 142/70 | HR 62 | Temp 97.0°F | Ht 73.5 in | Wt 199.2 lb

## 2019-11-06 DIAGNOSIS — R001 Bradycardia, unspecified: Secondary | ICD-10-CM

## 2019-11-06 DIAGNOSIS — I4811 Longstanding persistent atrial fibrillation: Secondary | ICD-10-CM

## 2019-11-06 DIAGNOSIS — I10 Essential (primary) hypertension: Secondary | ICD-10-CM | POA: Diagnosis not present

## 2019-11-06 DIAGNOSIS — I5032 Chronic diastolic (congestive) heart failure: Secondary | ICD-10-CM

## 2019-11-06 MED ORDER — FUROSEMIDE 20 MG PO TABS: 20.0000 mg | ORAL_TABLET | ORAL | 3 refills | Status: DC

## 2019-11-06 NOTE — Patient Instructions (Signed)
Medication Instructions:  CONTINUE METOPROLOL TARTRATE 25MG  TWICE DAILY TAKE LASIX (FUROSEMIDE) EVERY OTHER DAY *If you need a refill on your cardiac medications before your next appointment, please call your pharmacy*  Lab Work: None Ordered At This Time.  If you have labs (blood work) drawn today and your tests are completely normal, you will receive your results only by: MyChart Message (if you have MyChart) OR . A paper copy in the mail If you have any lab test that is abnormal or we need to change your treatment, we will call you to review the results.  Testing/Procedures: None Ordered At This Time.   Follow-Up: At [facility], you and your health needs are our priority.  As part of our continuing mission to provide you with exceptional heart care, we have created designated Provider Care Teams.  These Care Teams include your primary Cardiologist (physician) and Advanced Practice Providers (APPs -  Physician Assistants and Nurse Practitioners) who all work together to provide you with the care you need, when you need it.  We recommend signing up for the patient portal called "MyChart".  Sign up information is provided on this After Visit Summary.  MyChart is used to connect with patients for Virtual Visits (Telemedicine).  Patients are able to view lab/test results, encounter notes, upcoming appointments, etc.  Non-urgent messages can be sent to your provider as well.   To learn more about what you can do with MyChart, go to [facility].    Your next appointment:   6 month(s)  The format for your next appointment:   In Person  Provider:   [email or web address], MD

## 2020-05-05 NOTE — Progress Notes (Unsigned)
Cardiology Office Note:   Date:  05/06/2020  NAME:  Roger Briggs    MRN: [id number] DOB:  1929/12/17   PCP:  Pcp, No  Cardiologist:  No primary care provider on file.  Electrophysiologist:  None   Referring MD: No ref. provider found   Chief Complaint  Patient presents with  . Follow-up    History of Present Illness:   Roger Briggs is a 87 y.o. male with a hx of dementia, persistent AF (no AC due to falls/SDH), HFpEF who presents for follow-up.  He presents with his daughter.  Weights are up roughly 11 pounds since her last visit.  He does have some increased lower extremity edema.  Heart rate in the 50s.  BP 134/72.  He denies any chest pain or shortness of breath.  She informed me that there have been no falls.  We have opted against anticoagulation in the past.  I do agree with this recommendation.  Lasix has been cut by facility to 10 mg daily.  Likely needs to go back up to 20 mg daily.  I also recommended he pursue a salt restricted diet.  They will work on this with the facility.  He denies any chest pain or shortness of breath.  He is not that active.  He does use a walker.  Symptoms overall appear to be stable.  He is doing well.  Problem List 1. Hyperthyroidismon methimazole 2. Dementia 3. HTN 4. AAA s/p repair  5. Falls/Subdural hematoma 6. Atrial fibrillation, persistent  -no AC due to falls and prior SDH 7. HFpEF, 60-65% -BNP 280  Past Medical History: Past Medical History:  Diagnosis Date  . HTN (hypertension)   . Hyperthyroidism   . Mild cognitive impairment     Past Surgical History: Past Surgical History:  Procedure Laterality Date  . ABDOMINAL AORTIC ANEURYSM REPAIR      Current Medications: Current Meds  Medication Sig  . acetaminophen (TYLENOL) 325 MG tablet Take 650 mg by mouth every 6 (six) hours as needed.  Marland Kitchen augmented betamethasone dipropionate (DIPROLENE-AF) 0.05 % cream Apply 1 application topically 2 (two) times daily as needed  (itching).   . Emollient (CERAVE DAILY MOISTURIZING) LOTN apply to face as needed for irritation BID Externally 30 day(s)  . Ferrous Fumarate (HEMOCYTE - 106 MG FE) 324 (106 Fe) MG TABS tablet Take 1 tablet by mouth daily.  Marland Kitchen ibuprofen (ADVIL,MOTRIN) 200 MG tablet Take 200 mg by mouth every 6 (six) hours as needed.  Marland Kitchen lisinopril (PRINIVIL,ZESTRIL) 40 MG tablet Take 40 mg by mouth daily.  Marland Kitchen loratadine (CLARITIN) 10 MG tablet 1 tablet Once a day Orally 30 day(s)  . LORazepam (ATIVAN) 0.5 MG tablet Take 0.5 mg by mouth daily as needed (agitation).  . methimazole (TAPAZOLE) 5 MG tablet Take 5 mg by mouth daily.  . metoprolol tartrate (LOPRESSOR) 25 MG tablet Take 25 mg by mouth 2 (two) times daily.   . traZODone (DESYREL) 50 MG tablet Take 25 mg by mouth at bedtime.   . [DISCONTINUED] furosemide (LASIX) 20 MG tablet Take 1 tablet (20 mg total) by mouth every other day.     Allergies:    Patient has no known allergies.   Social History: Social History   Socioeconomic History  . Marital status: Widowed    Spouse name: Not on file  . Number of children: 5  . Years of education: Not on file  . Highest education level: Not on file  Occupational History  . Occupation:  OB Gyn    Comment: retired  Tobacco Use  . Smoking status: Current Every Day Smoker    Types: Pipe  . Smokeless tobacco: Never Used  Substance and Sexual Activity  . Alcohol use: Not Currently  . Drug use: Never  . Sexual activity: Not on file  Other Topics Concern  . Not on file  Social History Narrative  . Not on file   Social Determinants of Health   Financial Resource Strain: Not on file  Food Insecurity: Not on file  Transportation Needs: Not on file  Physical Activity: Not on file  Stress: Not on file  Social Connections: Not on file     Family History: The patient's family history includes Depression in his daughter; Hypertension in his daughter.  ROS:   All other ROS reviewed and negative. Pertinent  positives noted in the HPI.     EKGs/Labs/Other Studies Reviewed:   The following studies were personally reviewed by me today:  Recent Labs: 09/10/2019: BNP 280.1; BUN 23; Creatinine, Ser 1.19; Hemoglobin 11.9; Platelets 128; Potassium 4.8; Sodium 143; TSH 2.710   Recent Lipid Panel No results found for: CHOL, TRIG, HDL, CHOLHDL, VLDL, LDLCALC, LDLDIRECT  Physical Exam:   VS:  BP 134/72 (BP Location: Right Arm, Patient Position: Sitting, Cuff Size: Normal)   Pulse (!) 50   Ht 6' 1.5" (1.867 m)   Wt 210 lb (95.3 kg)   SpO2 97%   BMI 27.33 kg/m    Wt Readings from Last 3 Encounters:  05/06/20 210 lb (95.3 kg)  11/06/19 199 lb 3.2 oz (90.4 kg)  09/10/19 198 lb 9.6 oz (90.1 kg)    General: Well nourished, well developed, in no acute distress Head: Atraumatic, normal size  Eyes: PEERLA, EOMI  Neck: Supple, no JVD Endocrine: No thryomegaly Cardiac: Normal S1, S2; irregular rhythm, no murmurs rubs or gallops Lungs: Clear to auscultation bilaterally, no wheezing, rhonchi or rales  Abd: Soft, nontender, no hepatomegaly  Ext: 1+ edema in the ankles Musculoskeletal: No deformities, BUE and BLE strength normal and equal Skin: Warm and dry, no rashes   Neuro: Alert and oriented to person, place, time, and situation, CNII-XII grossly intact, no focal deficits  Psych: Normal mood and affect   ASSESSMENT:   Roger Briggs is a 87 y.o. male who presents for the following: 1. Bradycardia   2. Longstanding persistent atrial fibrillation (HCC)   3. Essential hypertension   4. Chronic diastolic heart failure (HCC)     PLAN:   1. Bradycardia 2. Longstanding persistent atrial fibrillation (HCC) -Bradycardic on examination.  No symptoms.  We will continue his metoprolol for now.  Should his heart rate continue to decline this can be stopped.  Echocardiogram showed normal LV function.  He does have A. fib but things are stable.  We have opted against anticoagulation given his prior history of  fall and subdural hematoma.  The family is not wanting to pursue this given his dementia.  I think this is reasonable.  3. Essential hypertension -BP well controlled.  Continue lisinopril 40 mg daily.  4. Chronic diastolic heart failure (HCC) -He is slightly volume up.  Weights are up roughly 11 pounds.  I recommended to increase his Lasix 20 mg daily.  We had a long discussion in the office about his care moving forward.  Given his advanced dementia we have decided on as needed follow-up.  They will let us know if anything changes.  Disposition: Return if symptoms worsen or fail  to improve.  Medication Adjustments/Labs and Tests Ordered: Current medicines are reviewed at length with the patient today.  Concerns regarding medicines are outlined above.  No orders of the defined types were placed in this encounter.  Meds ordered this encounter  Medications  . furosemide (LASIX) 20 MG tablet    Sig: Take 1 tablet (20 mg total) by mouth daily.    Dispense:  90 tablet    Refill:  3    Patient Instructions  Medication Instructions:  Increase Lasix to 20 mg daily   *If you need a refill on your cardiac medications before your next appointment, please call your pharmacy*  Follow-Up: At [facility], you and your health needs are our priority.  As part of our continuing mission to provide you with exceptional heart care, we have created designated Provider Care Teams.  These Care Teams include your primary Cardiologist (physician) and Advanced Practice Providers (APPs -  Physician Assistants and Nurse Practitioners) who all work together to provide you with the care you need, when you need it.  We recommend signing up for the patient portal called "MyChart".  Sign up information is provided on this After Visit Summary.  MyChart is used to connect with patients for Virtual Visits (Telemedicine).  Patients are able to view lab/test results, encounter notes, upcoming appointments, etc.   Non-urgent messages can be sent to your provider as well.   To learn more about what you can do with MyChart, go to [email or web address].    Your next appointment:   As needed  The format for your next appointment:   In Person  Provider:   Lennie Odor, MD       Time Spent with Patient: I have spent a total of 25 minutes with patient reviewing hospital notes, telemetry, EKGs, labs and examining the patient as well as establishing an assessment and plan that was discussed with the patient.  > 50% of time was spent in direct patient care.  Signed, Lenna Gilford. Flora Lipps, MD, [facility]  [facility]  [address], [address] [place], [place] [address] [phone]  05/06/2020 3:45 PM

## 2020-05-06 ENCOUNTER — Ambulatory Visit: Payer: Medicare Other | Admitting: Cardiovascular Disease

## 2020-05-06 ENCOUNTER — Encounter: Payer: Self-pay | Admitting: Cardiovascular Disease

## 2020-05-06 ENCOUNTER — Other Ambulatory Visit: Payer: Self-pay

## 2020-05-06 VITALS — BP 134/72 | HR 50 | Ht 73.5 in | Wt 210.0 lb

## 2020-05-06 DIAGNOSIS — I4811 Longstanding persistent atrial fibrillation: Secondary | ICD-10-CM

## 2020-05-06 DIAGNOSIS — I10 Essential (primary) hypertension: Secondary | ICD-10-CM

## 2020-05-06 DIAGNOSIS — R001 Bradycardia, unspecified: Secondary | ICD-10-CM

## 2020-05-06 DIAGNOSIS — I5032 Chronic diastolic (congestive) heart failure: Secondary | ICD-10-CM

## 2020-05-06 MED ORDER — FUROSEMIDE 20 MG PO TABS: 20.0000 mg | ORAL_TABLET | Freq: Every day | ORAL | 3 refills | Status: DC

## 2020-05-06 NOTE — Patient Instructions (Signed)
Medication Instructions:  Increase Lasix to 20 mg daily   *If you need a refill on your cardiac medications before your next appointment, please call your pharmacy*  Follow-Up: At [facility], you and your health needs are our priority.  As part of our continuing mission to provide you with exceptional heart care, we have created designated Provider Care Teams.  These Care Teams include your primary Cardiologist (physician) and Advanced Practice Providers (APPs -  Physician Assistants and Nurse Practitioners) who all work together to provide you with the care you need, when you need it.  We recommend signing up for the patient portal called "MyChart".  Sign up information is provided on this After Visit Summary.  MyChart is used to connect with patients for Virtual Visits (Telemedicine).  Patients are able to view lab/test results, encounter notes, upcoming appointments, etc.  Non-urgent messages can be sent to your provider as well.   To learn more about what you can do with MyChart, go to [email or web address].    Your next appointment:   As needed  The format for your next appointment:   In Person  Provider:   Lennie Odor, MD

## 2020-08-01 NOTE — Progress Notes (Signed)
Cardiology Office Note:    Date:  08/02/2020   ID:  Roger Briggs, DOB 1929-10-07, MRN [id number]  PCP:  Pcp, No Referring MD: No ref. provider found    [facility]  Cardiologist:  Roger Harps, MD   Reason for visit: Bradycardia  History of Present Illness:    Roger Briggs is a 87 y.o. male with a hx of dementia, persistent AF (no AC due to falls/SDH), HFpEF who presents for follow-up.  He last saw Dr. Guinevere Briggs on May 06, 2020.  Noted to be 11 pounds up with lower extremity edema and heart rates in the 50s.  Dr. Flora Lipps recommended the patient increase his Lasix from 10 mg to 20 mg daily.  Today, patient comes in with his 2 [place] ([place] in [place] and the other lives in [place]).  He lives in [facility].  His daughters state he has significant dementia and they answer my questions.  They have noticed he is more sleepy and will fall asleep during their 1 hour visits.  They think he is getting weaker.  The daughters state the patient does not complain.  They states is not having frequent falls like he did 1 year ago.  Pt had a substernal hematoma after 1 fall.  Pt does have LE edema but improved since Dr. Flora Lipps increased his lasix from 10mg  to 20mg  daily.  His assisted living does not weigh him/do vitals regularly.  Daughters state pt does not elevate his legs or wear compression socks regularly.  Pt denies dyspnea, bloating, lightheadedness, palpitations and syncope.  Family unaware of issues with tachycardia in the past.        Past Medical History:  Diagnosis Date   HTN (hypertension)    Hyperthyroidism    Mild cognitive impairment     Past Surgical History:  Procedure Laterality Date   ABDOMINAL AORTIC ANEURYSM REPAIR      Current Medications: Current Meds  Medication Sig   acetaminophen (TYLENOL) 325 MG tablet Take 650 mg by mouth every 6 (six) hours as needed.   augmented betamethasone dipropionate  (DIPROLENE-AF) 0.05 % cream Apply 1 application topically 2 (two) times daily as needed (itching).    Emollient (CERAVE DAILY MOISTURIZING) LOTN apply to face as needed for irritation BID Externally 30 day(s)   Ferrous Fumarate (HEMOCYTE - 106 MG FE) 324 (106 Fe) MG TABS tablet Take 1 tablet by mouth daily.   furosemide (LASIX) 20 MG tablet Take 1 tablet (20 mg total) by mouth daily.   ibuprofen (ADVIL,MOTRIN) 200 MG tablet Take 200 mg by mouth every 6 (six) hours as needed.   lisinopril (PRINIVIL,ZESTRIL) 40 MG tablet Take 40 mg by mouth daily.   loratadine (CLARITIN) 10 MG tablet 1 tablet Once a day Orally 30 day(s)   LORazepam (ATIVAN) 0.5 MG tablet Take 0.5 mg by mouth daily as needed (agitation).   methimazole (TAPAZOLE) 5 MG tablet Take 5 mg by mouth daily.   traZODone (DESYREL) 50 MG tablet Take 25 mg by mouth at bedtime.    [DISCONTINUED] metoprolol tartrate (LOPRESSOR) 25 MG tablet Take 25 mg by mouth 2 (two) times daily.      Allergies:   Patient has no known allergies.   Social History   Socioeconomic History   Marital status: Widowed    Spouse name: Not on file   Number of children: 5   Years of education: Not on file   Highest education level: Not on  file  Occupational History   Occupation: OB Gyn    Comment: retired  Tobacco Use   Smoking status: Every Day    Pack years: 0.00    Types: Pipe   Smokeless tobacco: Never  Substance and Sexual Activity   Alcohol use: Not Currently   Drug use: Never   Sexual activity: Not on file  Other Topics Concern   Not on file  Social History Narrative   Not on file   Social Determinants of Health   Financial Resource Strain: Not on file  Food Insecurity: Not on file  Transportation Needs: Not on file  Physical Activity: Not on file  Stress: Not on file  Social Connections: Not on file     Family History: The patient's family history includes Depression in his daughter; Hypertension in his daughter.  ROS:   Please  see the history of present illness.     EKGs/Labs/Other Studies Reviewed:    EKG:  The ekg ordered today demonstrates A-fib, HR 40.    Recent Labs: 09/10/2019: BNP 280.1; BUN 23; Creatinine, Ser 1.19; Hemoglobin 11.9; Platelets 128; Potassium 4.8; Sodium 143; TSH 2.710  Recent Lipid Panel No results found for: CHOL, TRIG, HDL, CHOLHDL, VLDL, LDLCALC, LDLDIRECT  Physical Exam:    VS:  BP 140/72 (BP Location: Left Arm, Patient Position: Sitting, Cuff Size: Normal)   Pulse (!) 40   Resp 18   Ht 6\' 1"  (1.854 m)   Wt 202 lb 9.6 oz (91.9 kg)   SpO2 99%   BMI 26.73 kg/m     Wt Readings from Last 3 Encounters:  08/02/20 202 lb 9.6 oz (91.9 kg)  05/06/20 210 lb (95.3 kg)  11/06/19 199 lb 3.2 oz (90.4 kg)     GEN:  Well nourished, well developed in no acute distress in wheelchair, elderly HEENT: Normal NECK: No JVD; No carotid bruits CARDIAC:  irreg irreg, no murmurs, rubs, gallops - HR 44 on palpation (checked for 60 seconds) RESPIRATORY:  Decreased BS in R base, Left side clear, No wheezing or rhonchi  ABDOMEN: Soft, non-tender, non-distended MUSCULOSKELETAL: 1+ edema to bilateral shins; No deformity  SKIN: Warm and dry NEUROLOGIC:  Alert and oriented PSYCHIATRIC:  Normal affect   ASSESSMENT AND PLAN   1. Bradycardia 2. Longstanding persistent atrial fibrillation (HCC) - normal EF by echo 09/2019 --> Stop metoprolol given HR 40s and increased sleepiness/weakness.  --> No anticoagulation given his prior history of fall, subdural hematoma and dementia.   --> Asked facility to check vitals daily.   3. Essential hypertension, reasonably controlled. --> Given age and history of falls, BP 140s/70s acceptable.  Continue current meds.   4. Chronic diastolic heart failure (HCC), mild volume overload. - Pt with mild LE edema below mid shin.  Decreased BS in R base, though may be scarring 2/2 hx of PNA.   --> Continue lasix 20mg  daily.  Recommend daily weights, compression socks and  leg elevation.  Reviewed labs from facility in 05/2020 - normal K and Cr.    Disposition -- Follow-up with me in 3 months unless issues arise.      Medication Adjustments/Labs and Tests Ordered: Current medicines are reviewed at length with the patient today.  Concerns regarding medicines are outlined above.  No orders of the defined types were placed in this encounter.  No orders of the defined types were placed in this encounter.   Patient Instructions  Medication Instructions:  STOP METOPROLOL *If you need a refill on your  cardiac medications before your next appointment, please call your pharmacy*  Lab Work: NONE  Special Instructions TAKE DAILY WEIGHTS, CALL IF WEIGHT INCREASE >3#/DAY -OR- >5#/WEEKLY  PLEASE READ AND FOLLOW SALTY 6-ATTACHED-1,800 mg daily  Follow-Up: Your next appointment:  3 month(s) In Person with Lennie Odor, MD OR IF UNAVAILABLE Juanda Crumble, PA-C   At [facility], you and your health needs are our priority.  As part of our continuing mission to provide you with exceptional heart care, we have created designated Provider Care Teams.  These Care Teams include your primary Cardiologist (physician) and Advanced Practice Providers (APPs -  Physician Assistants and Nurse Practitioners) who all work together to provide you with the care you need, when you need it.  We recommend signing up for the patient portal called "MyChart".  Sign up information is provided on this After Visit Summary.  MyChart is used to connect with patients for Virtual Visits (Telemedicine).  Patients are able to view lab/test results, encounter notes, upcoming appointments, etc.  Non-urgent messages can be sent to your provider as well.   To learn more about what you can do with MyChart, go to [email or web address].              Signed, Cannon Kettle, PA-C  08/02/2020 12:12 PM    [facility]

## 2020-08-02 ENCOUNTER — Ambulatory Visit: Payer: Medicare Other | Admitting: Physician Assistant

## 2020-08-02 ENCOUNTER — Other Ambulatory Visit: Payer: Self-pay

## 2020-08-02 ENCOUNTER — Encounter: Payer: Self-pay | Admitting: General Practice

## 2020-08-02 VITALS — BP 140/72 | HR 40 | Resp 18 | Ht 73.0 in | Wt 202.6 lb

## 2020-08-02 DIAGNOSIS — I10 Essential (primary) hypertension: Secondary | ICD-10-CM | POA: Diagnosis not present

## 2020-08-02 DIAGNOSIS — R001 Bradycardia, unspecified: Secondary | ICD-10-CM

## 2020-08-02 DIAGNOSIS — I4811 Longstanding persistent atrial fibrillation: Secondary | ICD-10-CM

## 2020-08-02 DIAGNOSIS — I5032 Chronic diastolic (congestive) heart failure: Secondary | ICD-10-CM | POA: Diagnosis not present

## 2020-08-02 NOTE — Patient Instructions (Signed)
Medication Instructions:  STOP METOPROLOL *If you need a refill on your cardiac medications before your next appointment, please call your pharmacy*  Lab Work: NONE  Special Instructions TAKE DAILY WEIGHTS, CALL IF WEIGHT INCREASE >3#/DAY -OR- >5#/WEEKLY  PLEASE READ AND FOLLOW SALTY 6-ATTACHED-1,800 mg daily  Follow-Up: Your next appointment:  3 month(s) In Person with Lennie Odor, MD OR IF UNAVAILABLE Juanda Crumble, PA-C   At [facility], you and your health needs are our priority.  As part of our continuing mission to provide you with exceptional heart care, we have created designated Provider Care Teams.  These Care Teams include your primary Cardiologist (physician) and Advanced Practice Providers (APPs -  Physician Assistants and Nurse Practitioners) who all work together to provide you with the care you need, when you need it.  We recommend signing up for the patient portal called "MyChart".  Sign up information is provided on this After Visit Summary.  MyChart is used to connect with patients for Virtual Visits (Telemedicine).  Patients are able to view lab/test results, encounter notes, upcoming appointments, etc.  Non-urgent messages can be sent to your provider as well.   To learn more about what you can do with MyChart, go to [email or web address].

## 2020-11-09 NOTE — Progress Notes (Signed)
Cardiology Office Note:    Date:  11/11/2020   ID:  Roger Briggs, DOB Feb 27, 1929, MRN [id number]  PCP:  [facility] Cardiologist: Reatha Harps, MD   Reason for visit: 33-month follow-up  History of Present Illness:    Roger Briggs is a 87 y.o. male with a hx of dementia, persistent AF (no AC due to falls/SDH), HFpEF.  He has 2 daughters (one lives in [place] and the other lives in [place]).  He lives in [facility].  His daughters state he has significant dementia.  I last saw him on August 02, 2020.  Since that appointment, his fatigue is stable.  His daughter showed me a vital log from his assisted living.  It shows heart rate 60s to 70s and blood pressures 120s to 150s systolic.  A primary care physician sees him every 2 to 3 weeks at this facility.  His daughter mentioned a new Med tech that almost sent him to the hospital with a heart rate of 45.  Facility staff reassured her that this is average for him.  His lower extremity swelling is stable.  Facility notes have it signed off that he is wearing compression stockings daily.  Otherwise, patient denies chest pain, dyspnea, bloating, PND, orthopnea, significant lightheadedness, palpitations and syncope.     Past Medical History:  Diagnosis Date   HTN (hypertension)    Hyperthyroidism    Mild cognitive impairment     Past Surgical History:  Procedure Laterality Date   ABDOMINAL AORTIC ANEURYSM REPAIR      Current Medications: Current Meds  Medication Sig   acetaminophen (TYLENOL) 325 MG tablet Take 650 mg by mouth every 6 (six) hours as needed.   augmented betamethasone dipropionate (DIPROLENE-AF) 0.05 % cream Apply 1 application topically 2 (two) times daily as needed (itching).    Emollient (CERAVE DAILY MOISTURIZING) LOTN apply to face as needed for irritation BID Externally 30 day(s)   Ferrous Fumarate (HEMOCYTE - 106 MG FE) 324 (106 Fe) MG TABS tablet Take 1 tablet by mouth  daily.   furosemide (LASIX) 20 MG tablet Take 1 tablet (20 mg total) by mouth daily.   ibuprofen (ADVIL,MOTRIN) 200 MG tablet Take 200 mg by mouth every 6 (six) hours as needed.   lisinopril (PRINIVIL,ZESTRIL) 40 MG tablet Take 40 mg by mouth daily.   loratadine (CLARITIN) 10 MG tablet 1 tablet Once a day Orally 30 day(s)   methimazole (TAPAZOLE) 5 MG tablet Take 5 mg by mouth daily.   traZODone (DESYREL) 50 MG tablet Take 25 mg by mouth at bedtime.    vitamin B-12 (CYANOCOBALAMIN) 1000 MCG tablet Take 1,000 mcg by mouth daily.     Allergies:   Patient has no known allergies.   Social History   Socioeconomic History   Marital status: Widowed    Spouse name: Not on file   Number of children: 5   Years of education: Not on file   Highest education level: Not on file  Occupational History   Occupation: OB Gyn    Comment: retired  Tobacco Use   Smoking status: Every Day    Types: Pipe   Smokeless tobacco: Never  Substance and Sexual Activity   Alcohol use: Not Currently   Drug use: Never   Sexual activity: Not on file  Other Topics Concern   Not on file  Social History Narrative   Not on file   Social Determinants of Corporate investment banker  Strain: Not on file  Food Insecurity: Not on file  Transportation Needs: Not on file  Physical Activity: Not on file  Stress: Not on file  Social Connections: Not on file     Family History: The patient's family history includes Depression in his daughter; Hypertension in his daughter.  ROS:   Please see the history of present illness.     EKGs/Labs/Other Studies Reviewed:    Recent Labs: No results found for requested labs within last 8760 hours.  Recent Lipid Panel No results found for: CHOL, TRIG, HDL, CHOLHDL, VLDL, LDLCALC, LDLDIRECT  Physical Exam:    VS:  BP 132/62 (BP Location: Right Arm, Patient Position: Sitting, Cuff Size: Normal)   Pulse (!) 44   Ht 6\' 2"  (1.88 m)   Wt 211 lb 6.4 oz (95.9 kg)   SpO2 96%    BMI 27.14 kg/m    No data found.  Wt Readings from Last 3 Encounters:  11/11/20 211 lb 6.4 oz (95.9 kg)  08/02/20 202 lb 9.6 oz (91.9 kg)  05/06/20 210 lb (95.3 kg)     GEN:  Well nourished, well developed in no acute distress, elderly  HEENT: Normal NECK: No JVD; No carotid bruits CARDIAC: irreg irreg, no murmurs, rubs, gallops RESPIRATORY:  Clear to auscultation bilaterally ABDOMEN: Soft, non-tender, non-distended MUSCULOSKELETAL: 1-2+ pitting edema to shins bilaterally; no compression stockings on currently SKIN: Warm and dry NEUROLOGIC:  Alert and oriented PSYCHIATRIC:  Normal affect   ASSESSMENT AND PLAN                      1. Bradycardia 2. Longstanding persistent atrial fibrillation (HCC) -normal EF by echo 09/2019 -Metoprolol stopped in June 2022 given HR 40s and increased sleepiness/weakness.  -Given no significant lightheadedness, no syncope and stable blood pressure -we will hold off on pacemaker consideration. -No anticoagulation given his prior history of fall, subdural hematoma and dementia.     3. Essential hypertension, controlled. --> Given age and history of falls, SBP 120-150s acceptable.  Continue current meds.   4. Chronic diastolic heart failure (HCC), mild volume overload. - Pt with stable mild LE edema below mid shin.   --> Continue lasix 20mg  daily.  Recommend weight check twice weekly, compression socks, daily walking and leg elevation.  Reviewed labs from facility - normal K and Cr.   --> Will defer on aggressive diuresis given the absence of other heart failure symptoms.  Likely has dependent edema.  I think compression stockings would be the most helpful as well as walking at least 3 times daily.   Disposition -- Follow-up 6 months with Dr. July 2022.   Patient Instructions  Medication Instructions:  No Changes *If you need a refill on your cardiac medications before your next appointment, please call your pharmacy*   Lab Work: No Labs If  you have labs (blood work) drawn today and your tests are completely normal, you will receive your results only by: MyChart Message (if you have MyChart) OR A paper copy in the mail If you have any lab test that is abnormal or we need to change your treatment, we will call you to review the results.   Testing/Procedures: No Testing   Follow-Up: At [facility], you and your health needs are our priority.  As part of our continuing mission to provide you with exceptional heart care, we have created designated Provider Care Teams.  These Care Teams include your primary Cardiologist (physician) and Advanced Practice Providers (  APPs -  Physician Assistants and Nurse Practitioners) who all work together to provide you with the care you need, when you need it.  We recommend signing up for the patient portal called "MyChart".  Sign up information is provided on this After Visit Summary.  MyChart is used to connect with patients for Virtual Visits (Telemedicine).  Patients are able to view lab/test results, encounter notes, upcoming appointments, etc.  Non-urgent messages can be sent to your provider as well.   To learn more about what you can do with MyChart, go to [email or web address].    Your next appointment:   6 month(s)  The format for your next appointment:   In Person  Provider:   Lennie Odor, MD   Other Instructions Continue to wear compressions stockings during the day.    Signed, Cannon Kettle, PA-C  11/11/2020 10:22 AM    [facility]

## 2020-11-11 ENCOUNTER — Encounter: Payer: Self-pay | Admitting: Physician Assistant

## 2020-11-11 ENCOUNTER — Ambulatory Visit: Payer: Medicare Other | Admitting: Physician Assistant

## 2020-11-11 ENCOUNTER — Other Ambulatory Visit: Payer: Self-pay

## 2020-11-11 VITALS — BP 132/62 | HR 44 | Ht 74.0 in | Wt 211.4 lb

## 2020-11-11 DIAGNOSIS — I10 Essential (primary) hypertension: Secondary | ICD-10-CM

## 2020-11-11 DIAGNOSIS — I4811 Longstanding persistent atrial fibrillation: Secondary | ICD-10-CM | POA: Diagnosis not present

## 2020-11-11 DIAGNOSIS — I5032 Chronic diastolic (congestive) heart failure: Secondary | ICD-10-CM | POA: Diagnosis not present

## 2020-11-11 DIAGNOSIS — R001 Bradycardia, unspecified: Secondary | ICD-10-CM

## 2020-11-11 NOTE — Patient Instructions (Signed)
Medication Instructions:  No Changes *If you need a refill on your cardiac medications before your next appointment, please call your pharmacy*   Lab Work: No Labs If you have labs (blood work) drawn today and your tests are completely normal, you will receive your results only by: MyChart Message (if you have MyChart) OR A paper copy in the mail If you have any lab test that is abnormal or we need to change your treatment, we will call you to review the results.   Testing/Procedures: No Testing   Follow-Up: At [facility], you and your health needs are our priority.  As part of our continuing mission to provide you with exceptional heart care, we have created designated Provider Care Teams.  These Care Teams include your primary Cardiologist (physician) and Advanced Practice Providers (APPs -  Physician Assistants and Nurse Practitioners) who all work together to provide you with the care you need, when you need it.  We recommend signing up for the patient portal called "MyChart".  Sign up information is provided on this After Visit Summary.  MyChart is used to connect with patients for Virtual Visits (Telemedicine).  Patients are able to view lab/test results, encounter notes, upcoming appointments, etc.  Non-urgent messages can be sent to your provider as well.   To learn more about what you can do with MyChart, go to [email or web address].    Your next appointment:   6 month(s)  The format for your next appointment:   In Person  Provider:   Lennie Odor, MD   Other Instructions Continue to wear compressions stockings during the day.

## 2021-04-10 ENCOUNTER — Other Ambulatory Visit: Payer: Self-pay

## 2021-04-10 ENCOUNTER — Encounter (HOSPITAL_BASED_OUTPATIENT_CLINIC_OR_DEPARTMENT_OTHER): Payer: Self-pay | Admitting: Obstetrics and Gynecology

## 2021-04-10 ENCOUNTER — Emergency Department (HOSPITAL_BASED_OUTPATIENT_CLINIC_OR_DEPARTMENT_OTHER): Payer: Medicare Other

## 2021-04-10 ENCOUNTER — Emergency Department (HOSPITAL_BASED_OUTPATIENT_CLINIC_OR_DEPARTMENT_OTHER)
Admission: EM | Admit: 2021-04-10 | Discharge: 2021-04-10 | Disposition: A | Discharge status: Home / Self Care | Payer: Medicare Other | Attending: Emergency Medicine | Admitting: Emergency Medicine

## 2021-04-10 DIAGNOSIS — Z23 Encounter for immunization: Secondary | ICD-10-CM | POA: Diagnosis not present

## 2021-04-10 DIAGNOSIS — I10 Essential (primary) hypertension: Secondary | ICD-10-CM | POA: Insufficient documentation

## 2021-04-10 DIAGNOSIS — S060XAA Concussion with loss of consciousness status unknown, initial encounter: Secondary | ICD-10-CM | POA: Insufficient documentation

## 2021-04-10 DIAGNOSIS — W19XXXA Unspecified fall, initial encounter: Secondary | ICD-10-CM | POA: Diagnosis not present

## 2021-04-10 DIAGNOSIS — S0990XA Unspecified injury of head, initial encounter: Secondary | ICD-10-CM

## 2021-04-10 DIAGNOSIS — R001 Bradycardia, unspecified: Secondary | ICD-10-CM | POA: Diagnosis not present

## 2021-04-10 DIAGNOSIS — Z79899 Other long term (current) drug therapy: Secondary | ICD-10-CM | POA: Insufficient documentation

## 2021-04-10 MED ORDER — TETANUS-DIPHTH-ACELL PERTUSSIS 5-2.5-18.5 LF-MCG/0.5 IM SUSY
0.5000 mL | PREFILLED_SYRINGE | Freq: Once | INTRAMUSCULAR | Status: AC
  Administered 2021-04-10: 0.5 mL via INTRAMUSCULAR
  Filled 2021-04-10: qty 0.5

## 2021-04-10 NOTE — Discharge Instructions (Addendum)
Based on the events which brought you to the ER today, it is possible that you may have a concussion. A concussion occurs when there is a blow to the head or body, with enough force to shake the brain and disrupt how the brain functions. You may experience symptoms such as headaches, sensitivity to light/noise, dizziness, cognitive slowing, difficulty concentrating / remembering, trouble sleeping and drowsiness. These symptoms may last anywhere from hours/days to potentially weeks/months. While these symptoms are very frustrating and perhaps debilitating, it is important that you remember that they will improve over time. Everyone has a different rate of recovery; it is difficult to predict when your symptoms will resolve. In order to allow for your brain to heal after the injury, we recommend that you see your primary physician or a physician knowledgeable in concussion management. We also advise you to let your body and brain rest: avoid physical activities (sports, gym, and exercise) and reduce cognitive demands (reading, texting, TV watching, computer use, video games, etc). School attendance, after-school activities and work may need to be modified to avoid increasing symptoms. We recommend against driving until until all symptoms have resolved. You should take 650mg of Acetaminophen (Tylenol) every 4 hours as needed for pain control; however, taking anti-inflammatory medication (Motrin/Advil/Ibuprofen) is not advised. Come back to the ER right away if you are having repeated episodes of vomiting, severe/worsening headache/dizziness or any other symptom that alarms you. We recommended that someone stay with you for the next 24 hours to monitor for these worrisome symptoms. 

## 2021-04-10 NOTE — ED Triage Notes (Signed)
Patient reports to the ER via [facility] from [facility]. Patient reports he had a fall and something hit his head'. Denies passing out.

## 2021-04-10 NOTE — ED Provider Notes (Signed)
[facility] EMERGENCY DEPT Provider Note   CSN: [id number] Arrival date & time: 04/10/21  [id number]     History  Chief Complaint  Patient presents with   [name]    Roger Briggs is a 88 y.o. male.  This is a 88 y.o. male with significant medical history as below, including dementia, bradycardia, longstanding presents atrial fibrillation not on anticoagulation who presents to the ED with complaint of fall.  Patient with history of dementia, level 5 caveat due to dementia.  History provided by daughter at bedside  Patient resides at nursing facility, had unwitnessed fall this morning, patient does not recall the fall.  He has wound to the posterior portion of his scalp.  Unsure of last tetanus shot.  He is unsure if he lost consciousness.  Does not use blood thinners.  He has mild headache described as aching otherwise no pain reported.  He is acting at his baseline per family at bedside.  No nausea, vomiting, vision or hearing changes.  No numbness or tingling.  No incontinence.  No chest pain or dyspnea.  Moving all extremities freely without discomfort.  Uses a walker at baseline for ambulation  History of bradycardia, atrial fibrillation, follows with cardiology.  No thinners secondary to prior subdural hematoma.  Past Medical History: No date: HTN (hypertension) No date: Hyperthyroidism No date: Mild cognitive impairment  Past Surgical History: No date: ABDOMINAL AORTIC ANEURYSM REPAIR    The history is provided by the patient and a relative. No language interpreter was used.  Fall Associated symptoms include headaches. Pertinent negatives include no chest pain, no abdominal pain and no shortness of breath.      Home Medications Prior to Admission medications   Medication Sig Start Date End Date Taking? Authorizing Provider  acetaminophen (TYLENOL) 325 MG tablet Take 650 mg by mouth every 6 (six) hours as needed.    [provider]  augmented  betamethasone dipropionate (DIPROLENE-AF) 0.05 % cream Apply 1 application topically 2 (two) times daily as needed (itching).  12/12/17   [provider]  Emollient (CERAVE DAILY MOISTURIZING) LOTN apply to face as needed for irritation BID Externally 30 day(s) 08/21/19   [provider]  Ferrous Fumarate (HEMOCYTE - 106 MG FE) 324 (106 Fe) MG TABS tablet Take 1 tablet by mouth daily. 09/19/19   [provider]  furosemide (LASIX) 20 MG tablet Take 1 tablet (20 mg total) by mouth daily. 05/06/20 11/11/20  O'NealCassie Freer, MD  ibuprofen (ADVIL,MOTRIN) 200 MG tablet Take 200 mg by mouth every 6 (six) hours as needed.    [provider]  lisinopril (PRINIVIL,ZESTRIL) 40 MG tablet Take 40 mg by mouth daily.    [provider]  loratadine (CLARITIN) 10 MG tablet 1 tablet Once a day Orally 30 day(s) 08/06/19   [provider]  methimazole (TAPAZOLE) 5 MG tablet Take 5 mg by mouth daily. 08/20/19   [provider]  traZODone (DESYREL) 50 MG tablet Take 25 mg by mouth at bedtime.     [provider]  vitamin B-12 (CYANOCOBALAMIN) 1000 MCG tablet Take 1,000 mcg by mouth daily. 10/23/20   [provider]      Allergies    Patient has no known allergies.    Review of Systems   Review of Systems  Constitutional:  Negative for chills and fever.  HENT:  Negative for facial swelling and trouble swallowing.   Eyes:  Negative for photophobia and visual disturbance.  Respiratory:  Negative  for cough and shortness of breath.   Cardiovascular:  Negative for chest pain and palpitations.  Gastrointestinal:  Negative for abdominal pain, nausea and vomiting.  Endocrine: Negative for polydipsia and polyuria.  Genitourinary:  Negative for difficulty urinating and hematuria.  Musculoskeletal:  Negative for gait problem and joint swelling.  Skin:  Positive for wound. Negative for pallor and rash.  Neurological:  Positive for headaches.  Negative for syncope.  Psychiatric/Behavioral:  Negative for agitation and confusion.    Physical Exam Updated Vital Signs BP (!) 160/81    Pulse (!) 39    Temp 98.6 F (37 C) (Oral)    Resp 17    Ht 6\' 2"  (1.88 m)    Wt 98.1 kg    SpO2 97%    BMI 27.77 kg/m  Physical Exam Vitals and nursing note reviewed.  Constitutional:      General: He is not in acute distress.    Appearance: He is well-developed. He is not ill-appearing, toxic-appearing or diaphoretic.  HENT:     Head: Normocephalic. No raccoon eyes, Battle's sign, right periorbital erythema or left periorbital erythema.     Jaw: There is normal jaw occlusion. No trismus, tenderness or pain on movement.     Comments: Superficial wound to occiput, no active bleeding.  No laceration, there appears to be an abrasion. no evidence of depressed skull fracture.    Right Ear: External ear normal. No mastoid tenderness. No hemotympanum.     Left Ear: External ear normal. No mastoid tenderness. No hemotympanum.     Ears:     Comments: Cerumen noted to right ear but able to visualize TM     Mouth/Throat:     Mouth: Mucous membranes are moist.  Eyes:     General: Vision grossly intact. Gaze aligned appropriately. No scleral icterus.    Extraocular Movements: Extraocular movements intact.     Pupils: Pupils are equal, round, and reactive to light.  Cardiovascular:     Rate and Rhythm: Normal rate and regular rhythm.     Pulses: Normal pulses.          Radial pulses are 2+ on the right side and 2+ on the left side.       Posterior tibial pulses are 2+ on the right side and 2+ on the left side.     Heart sounds: Normal heart sounds.  Pulmonary:     Effort: Pulmonary effort is normal. No respiratory distress.     Breath sounds: Normal breath sounds.  Abdominal:     General: Abdomen is flat.     Palpations: Abdomen is soft.     Tenderness: There is no abdominal tenderness.  Musculoskeletal:        General: Normal range of motion.      Cervical back: Full passive range of motion without pain and normal range of motion. No rigidity.     Right lower leg: Edema present.     Left lower leg: Edema present.     Comments: No midline spinous process tenderness to palpation or percussion, no crepitus or step-off.     Skin:    General: Skin is warm and dry.     Capillary Refill: Capillary refill takes less than 2 seconds.  Neurological:     Mental Status: He is alert and oriented to person, place, and time.     GCS: GCS eye subscore is 4. GCS verbal subscore is 5. GCS motor subscore is 6.  Cranial Nerves: Cranial nerves 2-12 are intact. No dysarthria or facial asymmetry.     Sensory: Sensation is intact.     Motor: Motor function is intact. No weakness.     Coordination: Coordination is intact.  Psychiatric:        Mood and Affect: Mood normal.        Behavior: Behavior normal.    ED Results / Procedures / Treatments   Labs (all labs ordered are listed, but only abnormal results are displayed) Labs Reviewed - No data to display  EKG EKG Interpretation  Date/Time:  Sunday April 10 2021 09:26:32 EST Ventricular Rate:  39 PR Interval:    QRS Duration: 104 QT Interval:  509 QTC Calculation: 410 R Axis:   -26 Text Interpretation: Atrial fibrillation Borderline left axis deviation No old tracing to compare Confirmed by Wynona Dove ([id number]) on 04/10/2021 10:24:10 AM  Radiology CT Head Wo Contrast  Result Date: 04/10/2021 CLINICAL DATA:  Facial trauma. Unwitnessed fall. Laceration of the posterior head. History of subdural hematoma in the past. EXAM: CT HEAD WITHOUT CONTRAST CT CERVICAL SPINE WITHOUT CONTRAST TECHNIQUE: Multidetector CT imaging of the head and cervical spine was performed following the standard protocol without intravenous contrast. Multiplanar CT image reconstructions of the cervical spine were also generated. RADIATION DOSE REDUCTION: This exam was performed according to the departmental  dose-optimization program which includes automated exposure control, adjustment of the mA and/or kV according to patient size and/or use of iterative reconstruction technique. COMPARISON:  01/06/2019 FINDINGS: CT HEAD FINDINGS Brain: There is significant central and cortical atrophy. Periventricular white matter change is consistent with small vessel disease. There is no intra or extra-axial fluid collection or mass lesion. The basilar cisterns and ventricles have a normal appearance. There is no CT evidence for acute infarction or hemorrhage. Vascular: No hyperdense vessel or unexpected calcification. Skull: Normal. Negative for fracture or focal lesion. Sinuses/Orbits: Orbits are unremarkable. There is mucosal thickening of the paranasal sinuses. Remote maxillary sinus surgery. Other: Moderate hematoma in the RIGHT posterior parietal scalp at the vertex. No underlying fracture. CT CERVICAL SPINE FINDINGS Alignment: Normal. Skull base and vertebrae: No acute fracture. No primary bone lesion or focal pathologic process. Soft tissues and spinal canal: Partially imaged 2.2 centimeter nodule in the posterior RIGHT thyroid lobe. Disc levels: Moderate disc height loss and associated uncovertebral spurring in the mid cervical levels. Upper chest: Negative. Other: None IMPRESSION: 1.  No evidence for acute intracranial abnormality. 2. Significant periventricular white matter changes and atrophy. 3. Moderate scalp hematoma in the RIGHT posterior parietal region. 4. No evidence for acute cervical spine abnormality. Moderate mid cervical degenerative changes. 5. Partially imaged 2.2 centimeter RIGHT thyroid nodule. In the setting of significant comorbidities or limited life expectancy, no follow-up recommended (ref: J Am Coll Radiol. 2015 Feb;12(2): 143-50). Electronically Signed   By: Nolon Nations M.D.   On: 04/10/2021 10:50   CT Cervical Spine Wo Contrast  Result Date: 04/10/2021 CLINICAL DATA:  Facial trauma.  Unwitnessed fall. Laceration of the posterior head. History of subdural hematoma in the past. EXAM: CT HEAD WITHOUT CONTRAST CT CERVICAL SPINE WITHOUT CONTRAST TECHNIQUE: Multidetector CT imaging of the head and cervical spine was performed following the standard protocol without intravenous contrast. Multiplanar CT image reconstructions of the cervical spine were also generated. RADIATION DOSE REDUCTION: This exam was performed according to the departmental dose-optimization program which includes automated exposure control, adjustment of the mA and/or kV according to patient size and/or use of iterative  reconstruction technique. COMPARISON:  01/06/2019 FINDINGS: CT HEAD FINDINGS Brain: There is significant central and cortical atrophy. Periventricular white matter change is consistent with small vessel disease. There is no intra or extra-axial fluid collection or mass lesion. The basilar cisterns and ventricles have a normal appearance. There is no CT evidence for acute infarction or hemorrhage. Vascular: No hyperdense vessel or unexpected calcification. Skull: Normal. Negative for fracture or focal lesion. Sinuses/Orbits: Orbits are unremarkable. There is mucosal thickening of the paranasal sinuses. Remote maxillary sinus surgery. Other: Moderate hematoma in the RIGHT posterior parietal scalp at the vertex. No underlying fracture. CT CERVICAL SPINE FINDINGS Alignment: Normal. Skull base and vertebrae: No acute fracture. No primary bone lesion or focal pathologic process. Soft tissues and spinal canal: Partially imaged 2.2 centimeter nodule in the posterior RIGHT thyroid lobe. Disc levels: Moderate disc height loss and associated uncovertebral spurring in the mid cervical levels. Upper chest: Negative. Other: None IMPRESSION: 1.  No evidence for acute intracranial abnormality. 2. Significant periventricular white matter changes and atrophy. 3. Moderate scalp hematoma in the RIGHT posterior parietal region. 4. No  evidence for acute cervical spine abnormality. Moderate mid cervical degenerative changes. 5. Partially imaged 2.2 centimeter RIGHT thyroid nodule. In the setting of significant comorbidities or limited life expectancy, no follow-up recommended (ref: J Am Coll Radiol. 2015 Feb;12(2): 143-50). Electronically Signed   By: Nolon Nations M.D.   On: 04/10/2021 10:50    Procedures Procedures    Medications Ordered in ED Medications  Tdap (BOOSTRIX) injection 0.5 mL (0.5 mLs Intramuscular Given 04/10/21 1048)    ED Course/ Medical Decision Making/ A&P                           Medical Decision Making  CC: Fall, head injury  This patient presents to the Emergency Department for the above complaint. This involves an extensive number of treatment options and is a complaint that carries with it a high risk of complications and morbidity. Vital signs were reviewed. Serious etiologies considered.  Record review:  Previous records obtained and reviewed   Additional history obtained from family at bedside/daughter  Medical and surgical history as noted above.   Work up as above, notable for:   imaging results that were available during my care of the patient were visualized by me and considered in my medical decision making.   I ordered imaging studies which included CT head and CT cervical spine and I visualized the imaging and I agree with radiologist interpretation.  Thyroid nodule, occipital hematoma noted otherwise no acute process.   Cardiac monitoring reviewed and interpreted personally which shows bradycardia, atrial fibrillation  Social determinants of health include -dementia, nursing home   Management: Boostrix, wound care  Reassessment:  Patient feeling better, no headache at this time.  Acting at baseline per family bedside.  Patient presents with low mechanism head trauma. On initial evaluation patient appears in no acute distress, afebrile with normal vital signs.  Patient has completely intact neurovascular exam, and pain improved in ED. tetanus was updated.  CT imaging of head and C-spine was obtained given patient's age, dementia  Patient does have atrial fibrillation with slow rate, follows with cardiology.  Family reports appointment cardiology coming up within the next couple weeks.  Daughter reports his baseline heart rate is between 35-50.  He is not symptomatic at this time.  Tolerating p.o.  He is no longer on metoprolol.  Follow-up with cardiology recommended.  Discussed possible etiology of concussion, signs and symptoms, and discharge instructions. Detailed discussions were had with the patient regarding current findings, and need for close f/u with PCP or on call doctor. The patient has been instructed to return immediately if the symptoms worsen in any way for re-evaluation. Patient verbalized understanding and is in agreement with current care plan. All questions answered prior to discharge        This chart was dictated using voice recognition software.  Despite best efforts to proofread,  errors can occur which can change the documentation meaning.    Amount and/or Complexity of Data Reviewed Independent Historian: caregiver External Data Reviewed: labs, radiology and notes. Radiology: ordered. Decision-making details documented in ED Course. ECG/medicine tests: ordered and independent interpretation performed. Decision-making details documented in ED Course. Discussion of management or test interpretation with external provider(s): Hospitalization was considered however feel patient would benefit from discharge back to nursing facility.  Risk Prescription drug management. Decision regarding hospitalization.           Final Clinical Impression(s) / ED Diagnoses Final diagnoses:  Closed head injury, initial encounter  Bradycardia  Concussion with unknown loss of consciousness status, initial encounter    Rx / DC  Orders ED Discharge Orders     None         Jeanell Sparrow, DO 04/10/21 1211

## 2021-05-02 NOTE — Progress Notes (Signed)
?Cardiology Office Note:   ?Date:  05/05/2021  ?NAME:  Roger Briggs    ?MRN: [id number] ?DOB:  [birth year]  ? ?PCP:  Pcp, No  ?Cardiologist:  Evalina Field, MD  ?Electrophysiologist:  None  ? ?Referring MD: No ref. provider found  ? ?Chief Complaint  ?Patient presents with  ? Bradycardia  ?   ?  ? ? ?History of Present Illness:   ?Roger Briggs is a 88 y.o. male with a hx of persistent Afib, HFpEF, HTN, dementia who presents for follow-up.  He presents with his daughter.  Recently suffered a fall.  Had a scalp laceration.  Recovered from this.  The incidences or circumstances of the fall are really unclear.  He does have dementia and cannot tell me if he was short of breath or dizzy or lightheaded.  While in the emergency room he did have bradycardia but heart rate does increase.  Heart rate is 52 today.  He does get short of breath with activity.  On Lasix 20 mg daily.  Seems to be doing well with this.  We did discuss pursuing a heart monitor to make sure he is not having any worsening bradycardia this will also let us know if his heart rate can increase.  Other option will be treadmill stress test but for now we will proceed with monitor.  No other changes to history.  1+ pitting edema in the lower extremities.  Overall seems to be within good spirits. ? ?Problem List ?1. Hyperthyroidism on methimazole ?2. Dementia ?3. HTN ?4. AAA s/p repair  ?5. Falls/Subdural hematoma  ?6. Atrial fibrillation, persistent  ?-no AC due to falls and prior SDH ?7. HFpEF, 60-65% ?-BNP 280 ? ?Past Medical History: ?Past Medical History:  ?Diagnosis Date  ? HTN (hypertension)   ? Hyperthyroidism   ? Mild cognitive impairment   ? ? ?Past Surgical History: ?Past Surgical History:  ?Procedure Laterality Date  ? ABDOMINAL AORTIC ANEURYSM REPAIR    ? ? ?Current Medications: ?Current Meds  ?Medication Sig  ? Emollient (CERAVE DAILY MOISTURIZING) LOTN apply to face as needed for irritation BID Externally 30 day(s)  ? FEROSUL 325 (65 Fe) MG  tablet Take 325 mg by mouth 3 (three) times a week.  ? furosemide (LASIX) 20 MG tablet Take 1 tablet (20 mg total) by mouth daily.  ? ibuprofen (ADVIL,MOTRIN) 200 MG tablet Take 200 mg by mouth every 6 (six) hours as needed.  ? lisinopril (PRINIVIL,ZESTRIL) 40 MG tablet Take 40 mg by mouth daily.  ? loratadine (CLARITIN) 10 MG tablet 1 tablet Once a day Orally 30 day(s)  ? methimazole (TAPAZOLE) 5 MG tablet Take 5 mg by mouth daily.  ? traZODone (DESYREL) 50 MG tablet Take 25 mg by mouth at bedtime.   ? vitamin B-12 (CYANOCOBALAMIN) 1000 MCG tablet Take 1,000 mcg by mouth daily.  ?  ? ?Allergies:    ?Patient has no known allergies.  ? ?Social History: ?Social History  ? ?Socioeconomic History  ? Marital status: Widowed  ?  Spouse name: Not on file  ? Number of children: 5  ? Years of education: Not on file  ? Highest education level: Not on file  ?Occupational History  ? Occupation: OB Gyn  ?  Comment: retired  ?Tobacco Use  ? Smoking status: Former  ?  Types: Pipe  ?  Quit date: 2019  ?  Years since quitting: 4.2  ?  Passive exposure: Never  ? Smokeless tobacco: Never  ?Vaping Use  ?  Vaping Use: Never used  ?Substance and Sexual Activity  ? Alcohol use: Not Currently  ? Drug use: Never  ? Sexual activity: Not Currently  ?Other Topics Concern  ? Not on file  ?Social History Narrative  ? Not on file  ? ?Social Determinants of Health  ? ?Financial Resource Strain: Not on file  ?Food Insecurity: Not on file  ?Transportation Needs: Not on file  ?Physical Activity: Not on file  ?Stress: Not on file  ?Social Connections: Not on file  ?  ? ?Family History: ?The patient's family history includes Depression in his daughter; Hypertension in his daughter. ? ?ROS:   ?All other ROS reviewed and negative. Pertinent positives noted in the HPI.    ? ?EKGs/Labs/Other Studies Reviewed:   ?The following studies were personally reviewed by me today: ? ?TTE 09/23/2019 ? 1. Left ventricular ejection fraction, by estimation, is 60 to 65%.  The  ?left ventricle has normal function. The left ventricle has no regional  ?wall motion abnormalities. There is mild concentric left ventricular  ?hypertrophy. Left ventricular diastolic  ?parameters are indeterminate. The average left ventricular global  ?longitudinal strain is -21.5 %. The global longitudinal strain is normal.  ? 2. Right ventricular systolic function is mildly reduced. The right  ?ventricular size is moderately enlarged. There is normal pulmonary artery  ?systolic pressure.  ? 3. Right atrial size was mildly dilated.  ? 4. Left atrial size was severely dilated.  ? 5. The mitral valve is normal in structure. Mild mitral valve  ?regurgitation. No evidence of mitral stenosis.  ? 6. The aortic valve is tricuspid. Aortic valve regurgitation is trivial.  ?Moderate aortic valve sclerosis/calcification is present, without any  ?evidence of aortic stenosis.  ? 7. Aortic dilatation noted. There is mild dilatation of the aortic root  ?measuring 42 mm.  ? 8. The inferior vena cava is normal in size with greater than 50%  ?respiratory variability, suggesting right atrial pressure of 3 mmHg.  ? ?Recent Labs: ?No results found for requested labs within last 8760 hours.  ? ?Recent Lipid Panel ?No results found for: CHOL, TRIG, HDL, CHOLHDL, VLDL, LDLCALC, LDLDIRECT ? ?Physical Exam:   ?VS:  BP 136/70   Pulse (!) 52   Ht 6' 1.5" (1.867 m)   Wt 209 lb 6.4 oz (95 kg)   SpO2 95%   BMI 27.25 kg/m?    ?Wt Readings from Last 3 Encounters:  ?05/05/21 209 lb 6.4 oz (95 kg)  ?04/10/21 216 lb 4.3 oz (98.1 kg)  ?11/11/20 211 lb 6.4 oz (95.9 kg)  ?  ?General: Well nourished, well developed, in no acute distress ?Head: Atraumatic, normal size  ?Eyes: PEERLA, EOMI  ?Neck: Supple, no JVD ?Endocrine: No thryomegaly ?Cardiac: Normal S1, S2; irregular rhythm ?Lungs: Clear to auscultation bilaterally, no wheezing, rhonchi or rales  ?Abd: Soft, nontender, no hepatomegaly  ?Ext: No edema, pulses 2+ ?Musculoskeletal: No  deformities, BUE and BLE strength normal and equal ?Skin: Warm and dry, no rashes   ?Neuro: Alert and oriented to person, place, time, and situation, CNII-XII grossly intact, no focal deficits  ?Psych: Normal mood and affect  ? ?ASSESSMENT:   ?Leotis Bracey is a 88 y.o. male who presents for the following: ?1. Longstanding persistent atrial fibrillation ([place])   ?2. Bradycardia   ?3. Chronic diastolic heart failure ([place])   ?4. Essential hypertension   ? ? ?PLAN:   ?1. Longstanding persistent atrial fibrillation ([place]) ?2. Bradycardia ?-Long history of atrial fibrillation.  Has  had bradycardia for several years now.  Recent falls are concerning.  Heart rate 50s today.  We will proceed with a monitor to exclude any dangerous bradycardia.  He is not on anticoagulation due to frequent fall risk and discussion with family.  They are not wanting him to be on any blood thinners.  Pacemaker would be within his scope of care.  However unclear if he needs this.  I think a monitor will help [place].  Given his dementia the circumstances of the fall are unclear.  For now we will proceed with monitor.  TSH was recent normal.  Echo shows normal LV function. ? ?3. Chronic diastolic heart failure ([place]) ?-He does have diastolic heart failure.  On Lasix 20 mg daily.  Lives in assisted living.  Unfortunately cannot control his salt intake. ? ?4. Essential hypertension ?-Well-controlled today.  Continue lisinopril 40 mg daily.  Continue Lasix 20 mg daily. ? ?Disposition: Return in about 6 months (around 11/05/2021). ? ?Medication Adjustments/Labs and Tests Ordered: ?Current medicines are reviewed at length with the patient today.  Concerns regarding medicines are outlined above.  ?Orders Placed This Encounter  ?Procedures  ? LONG TERM MONITOR (3-14 DAYS)  ? ?No orders of the defined types were placed in this encounter. ? ? ?Patient Instructions  ?Medication Instructions:  ?The current medical regimen is effective;  continue present plan and  medications. ? ?*If you need a refill on your cardiac medications before your next appointment, please call your pharmacy* ? ? ?Testing/Procedures: ?ZIO XT- Long Term Monitor Instructions ? ?Your physician has r

## 2021-05-05 ENCOUNTER — Ambulatory Visit (INDEPENDENT_AMBULATORY_CARE_PROVIDER_SITE_OTHER): Payer: Medicare Other | Admitting: Cardiovascular Disease

## 2021-05-05 ENCOUNTER — Other Ambulatory Visit: Payer: Self-pay

## 2021-05-05 ENCOUNTER — Encounter: Payer: Self-pay | Admitting: Cardiovascular Disease

## 2021-05-05 ENCOUNTER — Ambulatory Visit (INDEPENDENT_AMBULATORY_CARE_PROVIDER_SITE_OTHER): Payer: Medicare Other

## 2021-05-05 VITALS — BP 136/70 | HR 52 | Ht 73.5 in | Wt 209.4 lb

## 2021-05-05 DIAGNOSIS — I10 Essential (primary) hypertension: Secondary | ICD-10-CM

## 2021-05-05 DIAGNOSIS — R001 Bradycardia, unspecified: Secondary | ICD-10-CM

## 2021-05-05 DIAGNOSIS — I4811 Longstanding persistent atrial fibrillation: Secondary | ICD-10-CM

## 2021-05-05 DIAGNOSIS — I5032 Chronic diastolic (congestive) heart failure: Secondary | ICD-10-CM

## 2021-05-05 NOTE — Patient Instructions (Signed)
Medication Instructions:  ?The current medical regimen is effective;  continue present plan and medications. ? ?*If you need a refill on your cardiac medications before your next appointment, please call your pharmacy* ? ? ?Testing/Procedures: ?ZIO XT- Long Term Monitor Instructions ? ?Your physician has requested you wear a ZIO patch monitor for 3 days.  ?This is a single patch monitor. [employer] supplies one patch monitor per enrollment. Additional ?stickers are not available. Please do not apply patch if you will be having a Nuclear Stress Test,  ?Echocardiogram, Cardiac CT, MRI, or Chest Xray during the period you would be wearing the  ?monitor. The patch cannot be worn during these tests. You cannot remove and re-apply the  ?ZIO XT patch monitor.  ?Your ZIO patch monitor will be mailed 3 day USPS to your address on file. It may take 3-5 days  ?to receive your monitor after you have been enrolled.  ?Once you have received your monitor, please review the enclosed instructions. Your monitor  ?has already been registered assigning a specific monitor serial # to you. ? ?Billing and Patient Assistance Program Information ? ?We have supplied [employer] with any of your insurance information on file for billing purposes. ?[employer] offers a sliding scale Patient Assistance Program for patients that do not have  ?insurance, or whose insurance does not completely cover the cost of the ZIO monitor.  ?You must apply for the Patient Assistance Program to qualify for this discounted rate.  ?To apply, please call [employer] at [phone], select option 4, select option 2, ask to apply for  ?Patient Assistance Program. Meredeth Ide will ask your household income, and how many people  ?are in your household. They will quote your out-of-pocket cost based on that information.  ?[employer] will also be able to set up a 76-month, interest-free payment plan if needed. ? ?Applying the monitor ?  ?Shave hair from upper left chest.  ?Hold abrader disc  by orange tab. Rub abrader in 40 strokes over the upper left chest as  ?indicated in your monitor instructions.  ?Clean area with 4 enclosed alcohol pads. Let dry.  ?Apply patch as indicated in monitor instructions. Patch will be placed under collarbone on left  ?side of chest with arrow pointing upward.  ?Rub patch adhesive wings for 2 minutes. Remove white label marked "1". Remove the white  ?label marked "2". Rub patch adhesive wings for 2 additional minutes.  ?While looking in a mirror, press and release button in center of patch. A small green light will  ?flash 3-4 times. This will be your only indicator that the monitor has been turned on.  ?Do not shower for the first 24 hours. You may shower after the first 24 hours.  ?Press the button if you feel a symptom. You will hear a small click. Record Date, Time and  ?Symptom in the Patient Logbook.  ?When you are ready to remove the patch, follow instructions on the last 2 pages of Patient  ?Logbook. Stick patch monitor onto the last page of Patient Logbook.  ?Place Patient Logbook in the blue and white box. Use locking tab on box and tape box closed  ?securely. The blue and white box has prepaid postage on it. Please place it in the mailbox as  ?soon as possible. Your physician should have your test results approximately 7 days after the  ?monitor has been mailed back to [facility].  ?Call [facility] at [phone] if you have questions regarding  ?your ZIO XT  patch monitor. Call them immediately if you see an orange light blinking on your  ?monitor.  ?If your monitor falls off in less than 4 days, contact our Monitor department at [phone].  ?If your monitor becomes loose or falls off after 4 days call [employer] at [phone] for  ?suggestions on securing your monitor  ? ? ?Follow-Up: ?At [facility], you and your health needs are our priority.  As part of our continuing mission to provide you with exceptional heart care, we have  created designated Provider Care Teams.  These Care Teams include your primary Cardiologist (physician) and Advanced Practice Providers (APPs -  Physician Assistants and Nurse Practitioners) who all work together to provide you with the care you need, when you need it. ? ?We recommend signing up for the patient portal called "MyChart".  Sign up information is provided on this After Visit Summary.  MyChart is used to connect with patients for Virtual Visits (Telemedicine).  Patients are able to view lab/test results, encounter notes, upcoming appointments, etc.  Non-urgent messages can be sent to your provider as well.   ?To learn more about what you can do with MyChart, go to [email or web address].   ? ?Your next appointment:   ?6 month(s) ? ?The format for your next appointment:   ?In Person ? ?Provider:   ?Reatha Harps, MD   ? ? ? ?

## 2021-05-05 NOTE — Progress Notes (Unsigned)
Enrolled for [employer] to mail a ZIO XT long term holter monitor to the patients address on file.  

## 2021-05-25 ENCOUNTER — Telehealth: Payer: Self-pay | Admitting: Cardiovascular Disease

## 2021-05-25 NOTE — Telephone Encounter (Signed)
?  Roger Briggs states that Dr Flora Lipps wanted the patient to wear a heart monitor but they will need an order sent to [facility] assisted living for him to have one. They will also need one for Home Health to come in and place the monitor. Orders can be sent to [phone].  ?

## 2021-05-25 NOTE — Telephone Encounter (Signed)
-  Spoke with Roger Briggs who report they are needing orders faxed to their facility for monitor as well as home health orders to have a nurse place monitor. ?-Nurse advised that a message would be sent to [place] regarding home health orders, but then attempted to contact Roger Briggs back to see if it would be easier to schedule an appointment to have monitor placed at our [address] office.  ? ?-Unable to leave message ?

## 2021-05-27 NOTE — Telephone Encounter (Signed)
Mateo Flow calling to f/u on orders requested for pt. She states that she is leaving at 3:30 and would like a callback by then if possible. If not she states that nurse can call the facility at [phone] and ask for [place]. Please advise ?

## 2021-05-27 NOTE — Telephone Encounter (Signed)
I contacted Vikki Ports, she stated they needed an order to do the monitor, and have home health come and place the monitor on, then would need another order to have monitor removed. I advised that I could have patient bring monitor to office and place on for him, he can return in 3 days and I will remove and have it sent off. I placed patient on nurse visit for Monday 04/17 at 1:00 PM- will schedule for 3 days out during this visit.  ? ?Will route to MD to make aware. Thanks! ?

## 2021-05-30 ENCOUNTER — Ambulatory Visit: Payer: Medicare Other

## 2021-05-30 ENCOUNTER — Telehealth: Payer: Self-pay | Admitting: Cardiovascular Disease

## 2021-05-30 NOTE — Telephone Encounter (Signed)
Attempted to call, no answer.  ?Will try again later to get re-scheduled. ? ?

## 2021-05-30 NOTE — Telephone Encounter (Signed)
[facility] called in canceling the patient's nurse visit for today and is requesting a callback to reschedule for the patient to have his monitor placed.  ?

## 2021-06-10 NOTE — Telephone Encounter (Signed)
Spoke with Pollie Meyer, nurse room appointment made for monitor to be applied to the patient. ?

## 2021-06-14 NOTE — Telephone Encounter (Signed)
Noted. Thanks.

## 2021-06-21 ENCOUNTER — Other Ambulatory Visit: Payer: Medicare Other

## 2021-06-21 DIAGNOSIS — I4811 Longstanding persistent atrial fibrillation: Secondary | ICD-10-CM | POA: Diagnosis not present

## 2021-06-21 NOTE — Progress Notes (Unsigned)
? ?  Nurse Visit  ? ?Date of Encounter: 06/21/2021 ?ID: [name], DOB June 01, 1929, MRN [id number] ? ?PCP:  Pcp, No ?  ?[facility] Providers ?Cardiologist:  Reatha Harps, MD { ? ? ? ?Visit Details  ? ?VS:  Ht 6' 1.5" (1.867 m)   Wt 214 lb (97.1 kg)   BMI 27.85 kg/m?  , BMI Body mass index is 27.85 kg/m?. ? ?Wt Readings from Last 3 Encounters:  ?06/21/21 214 lb (97.1 kg)  ?05/05/21 209 lb 6.4 oz (95 kg)  ?04/10/21 216 lb 4.3 oz (98.1 kg)  ?  ? ?Reason for visit: place on ZIO monitor. ?Performed today: Vitals and Education ?Changes (medications, testing, etc.) : n/a ?Length of Visit: 5 minutes ? ? ? ?Medications Adjustments/Labs and Tests Ordered: ?No orders of the defined types were placed in this encounter. ? ?No orders of the defined types were placed in this encounter. ? ? ? ?Signed, ?Cydney Ok, LPN  ?06/16/2990 4:26 PM  ?

## 2021-07-04 ENCOUNTER — Telehealth (HOSPITAL_BASED_OUTPATIENT_CLINIC_OR_DEPARTMENT_OTHER): Payer: Self-pay

## 2021-07-04 NOTE — Telephone Encounter (Signed)
Call received from [facility] regarding recent monitor wear.  Pt wore the monitor from May 9th thru May 13th.  Pt experienced 1, 3 second pause, Afib 100% of wear time.  Pt's lowest rate was 35 BPM for 60 seconds.  Discussed with DOD, Dr Rennis Golden.  No changes at this time to treatment regimen.  Dr. Flora Lipps will   review report next in office day. Jim Like MHA RN CCM

## 2021-07-22 ENCOUNTER — Inpatient Hospital Stay (HOSPITAL_COMMUNITY)
Admission: EM | Admit: 2021-07-22 | Discharge: 2021-07-25 | DRG: 179 | Disposition: A | Discharge status: Home Health | Payer: Medicare Other | Source: Skilled Nursing Facility | Attending: Internal Medicine | Admitting: Internal Medicine

## 2021-07-22 ENCOUNTER — Emergency Department (HOSPITAL_COMMUNITY): Payer: Medicare Other

## 2021-07-22 ENCOUNTER — Encounter (HOSPITAL_COMMUNITY): Payer: Self-pay

## 2021-07-22 DIAGNOSIS — Z20822 Contact with and (suspected) exposure to covid-19: Secondary | ICD-10-CM | POA: Diagnosis present

## 2021-07-22 DIAGNOSIS — R1312 Dysphagia, oropharyngeal phase: Secondary | ICD-10-CM | POA: Diagnosis present

## 2021-07-22 DIAGNOSIS — I712 Thoracic aortic aneurysm, without rupture, unspecified: Secondary | ICD-10-CM | POA: Diagnosis present

## 2021-07-22 DIAGNOSIS — R001 Bradycardia, unspecified: Secondary | ICD-10-CM | POA: Diagnosis present

## 2021-07-22 DIAGNOSIS — F0390 Unspecified dementia without behavioral disturbance: Secondary | ICD-10-CM | POA: Diagnosis present

## 2021-07-22 DIAGNOSIS — I480 Paroxysmal atrial fibrillation: Secondary | ICD-10-CM | POA: Diagnosis present

## 2021-07-22 DIAGNOSIS — I10 Essential (primary) hypertension: Secondary | ICD-10-CM | POA: Diagnosis not present

## 2021-07-22 DIAGNOSIS — E039 Hypothyroidism, unspecified: Secondary | ICD-10-CM | POA: Diagnosis present

## 2021-07-22 DIAGNOSIS — I110 Hypertensive heart disease with heart failure: Secondary | ICD-10-CM | POA: Diagnosis present

## 2021-07-22 DIAGNOSIS — Z8249 Family history of ischemic heart disease and other diseases of the circulatory system: Secondary | ICD-10-CM | POA: Diagnosis not present

## 2021-07-22 DIAGNOSIS — D6959 Other secondary thrombocytopenia: Secondary | ICD-10-CM | POA: Diagnosis present

## 2021-07-22 DIAGNOSIS — I2729 Other secondary pulmonary hypertension: Secondary | ICD-10-CM | POA: Diagnosis present

## 2021-07-22 DIAGNOSIS — I50810 Right heart failure, unspecified: Secondary | ICD-10-CM | POA: Diagnosis present

## 2021-07-22 DIAGNOSIS — E0590 Thyrotoxicosis, unspecified without thyrotoxic crisis or storm: Secondary | ICD-10-CM | POA: Diagnosis present

## 2021-07-22 DIAGNOSIS — I2510 Atherosclerotic heart disease of native coronary artery without angina pectoris: Secondary | ICD-10-CM | POA: Diagnosis present

## 2021-07-22 DIAGNOSIS — J40 Bronchitis, not specified as acute or chronic: Secondary | ICD-10-CM | POA: Diagnosis present

## 2021-07-22 DIAGNOSIS — Z66 Do not resuscitate: Secondary | ICD-10-CM | POA: Diagnosis present

## 2021-07-22 DIAGNOSIS — J156 Pneumonia due to other aerobic Gram-negative bacteria: Principal | ICD-10-CM | POA: Diagnosis present

## 2021-07-22 DIAGNOSIS — Z818 Family history of other mental and behavioral disorders: Secondary | ICD-10-CM | POA: Diagnosis not present

## 2021-07-22 DIAGNOSIS — D696 Thrombocytopenia, unspecified: Secondary | ICD-10-CM | POA: Diagnosis not present

## 2021-07-22 DIAGNOSIS — Z79899 Other long term (current) drug therapy: Secondary | ICD-10-CM | POA: Diagnosis not present

## 2021-07-22 DIAGNOSIS — J189 Pneumonia, unspecified organism: Secondary | ICD-10-CM | POA: Diagnosis present

## 2021-07-22 DIAGNOSIS — Z87891 Personal history of nicotine dependence: Secondary | ICD-10-CM

## 2021-07-22 DIAGNOSIS — I714 Abdominal aortic aneurysm, without rupture, unspecified: Secondary | ICD-10-CM | POA: Diagnosis present

## 2021-07-22 LAB — CBC WITH DIFFERENTIAL/PLATELET
Abs Immature Granulocytes: 0.06 10*3/uL (ref 0.00–0.07)
Basophils Absolute: 0 10*3/uL (ref 0.0–0.1)
Basophils Relative: 0 %
Eosinophils Absolute: 0 10*3/uL (ref 0.0–0.5)
Eosinophils Relative: 0 %
HCT: 33.4 % — ABNORMAL LOW (ref 39.0–52.0)
Hemoglobin: 11.2 g/dL — ABNORMAL LOW (ref 13.0–17.0)
Immature Granulocytes: 1 %
Lymphocytes Relative: 9 %
Lymphs Abs: 0.7 10*3/uL (ref 0.7–4.0)
MCH: 31.7 pg (ref 26.0–34.0)
MCHC: 33.5 g/dL (ref 30.0–36.0)
MCV: 94.6 fL (ref 80.0–100.0)
Monocytes Absolute: 0.5 10*3/uL (ref 0.1–1.0)
Monocytes Relative: 7 %
Neutro Abs: 6.9 10*3/uL (ref 1.7–7.7)
Neutrophils Relative %: 83 %
Platelets: 78 10*3/uL — ABNORMAL LOW (ref 150–400)
RBC: 3.53 MIL/uL — ABNORMAL LOW (ref 4.22–5.81)
RDW: 12.8 % (ref 11.5–15.5)
WBC: 8.2 10*3/uL (ref 4.0–10.5)
nRBC: 0 % (ref 0.0–0.2)

## 2021-07-22 LAB — PROTIME-INR
INR: 1.4 — ABNORMAL HIGH (ref 0.8–1.2)
Prothrombin Time: 16.9 seconds — ABNORMAL HIGH (ref 11.4–15.2)

## 2021-07-22 LAB — COMPREHENSIVE METABOLIC PANEL
ALT: 18 U/L (ref 0–44)
AST: 24 U/L (ref 15–41)
Albumin: 3.2 g/dL — ABNORMAL LOW (ref 3.5–5.0)
Alkaline Phosphatase: 56 U/L (ref 38–126)
Anion gap: 8 (ref 5–15)
BUN: 14 mg/dL (ref 8–23)
CO2: 25 mmol/L (ref 22–32)
Calcium: 9.2 mg/dL (ref 8.9–10.3)
Chloride: 106 mmol/L (ref 98–111)
Creatinine, Ser: 1.05 mg/dL (ref 0.61–1.24)
GFR, Estimated: >60 mL/min (ref >60)
Glucose, Bld: 99 mg/dL (ref 70–99)
Potassium: 3.6 mmol/L (ref 3.5–5.1)
Sodium: 139 mmol/L (ref 135–145)
Total Bilirubin: 1.1 mg/dL (ref 0.3–1.2)
Total Protein: 6.2 g/dL — ABNORMAL LOW (ref 6.5–8.1)

## 2021-07-22 LAB — LACTIC ACID, PLASMA: Lactic Acid, Venous: 1.3 mmol/L (ref 0.5–1.9)

## 2021-07-22 LAB — PROCALCITONIN: Procalcitonin: 0.83 ng/mL

## 2021-07-22 LAB — SARS CORONAVIRUS 2 BY RT PCR: SARS Coronavirus 2 by RT PCR: NEGATIVE

## 2021-07-22 LAB — APTT: aPTT: 32 seconds (ref 24–36)

## 2021-07-22 MED ORDER — BISACODYL 5 MG PO TBEC: 5.0000 mg | DELAYED_RELEASE_TABLET | Freq: Every day | ORAL | Status: DC | PRN

## 2021-07-22 MED ORDER — SODIUM CHLORIDE 0.9 % IV SOLN
500.0000 mg | INTRAVENOUS | Status: DC
  Administered 2021-07-22 – 2021-07-25 (×4): 500 mg via INTRAVENOUS
  Filled 2021-07-22 (×4): qty 5

## 2021-07-22 MED ORDER — ACETAMINOPHEN 650 MG RE SUPP: 650.0000 mg | Freq: Four times a day (QID) | RECTAL | Status: DC | PRN

## 2021-07-22 MED ORDER — MORPHINE SULFATE (PF) 2 MG/ML IV SOLN: 2.0000 mg | INTRAVENOUS | Status: DC | PRN

## 2021-07-22 MED ORDER — GUAIFENESIN ER 600 MG PO TB12
600.0000 mg | ORAL_TABLET | Freq: Two times a day (BID) | ORAL | Status: DC | PRN
  Administered 2021-07-25: 600 mg via ORAL
  Filled 2021-07-22: qty 1

## 2021-07-22 MED ORDER — ONDANSETRON HCL 4 MG PO TABS: 4.0000 mg | ORAL_TABLET | Freq: Four times a day (QID) | ORAL | Status: DC | PRN

## 2021-07-22 MED ORDER — SODIUM CHLORIDE 0.9 % IV SOLN: Freq: Once | INTRAVENOUS | Status: AC

## 2021-07-22 MED ORDER — POLYETHYLENE GLYCOL 3350 17 G PO PACK: 17.0000 g | PACK | Freq: Every day | ORAL | Status: DC | PRN

## 2021-07-22 MED ORDER — TRAZODONE HCL 50 MG PO TABS
25.0000 mg | ORAL_TABLET | Freq: Every day | ORAL | Status: DC
  Administered 2021-07-23 – 2021-07-24 (×2): 25 mg via ORAL
  Filled 2021-07-22 (×2): qty 1

## 2021-07-22 MED ORDER — SODIUM CHLORIDE 0.9% FLUSH
3.0000 mL | Freq: Two times a day (BID) | INTRAVENOUS | Status: DC
  Administered 2021-07-23 – 2021-07-25 (×4): 3 mL via INTRAVENOUS

## 2021-07-22 MED ORDER — HYDRALAZINE HCL 20 MG/ML IJ SOLN
5.0000 mg | INTRAMUSCULAR | Status: DC | PRN
  Administered 2021-07-23 – 2021-07-24 (×2): 5 mg via INTRAVENOUS
  Filled 2021-07-22 (×2): qty 1

## 2021-07-22 MED ORDER — ONDANSETRON HCL 4 MG/2ML IJ SOLN: 4.0000 mg | Freq: Four times a day (QID) | INTRAMUSCULAR | Status: DC | PRN

## 2021-07-22 MED ORDER — SODIUM CHLORIDE 0.9 % IV SOLN
1.0000 g | Freq: Once | INTRAVENOUS | Status: AC
  Administered 2021-07-22: 1 g via INTRAVENOUS
  Filled 2021-07-22: qty 10

## 2021-07-22 MED ORDER — ENOXAPARIN SODIUM 40 MG/0.4ML IJ SOSY: 40.0000 mg | PREFILLED_SYRINGE | INTRAMUSCULAR | Status: DC

## 2021-07-22 MED ORDER — DOCUSATE SODIUM 100 MG PO CAPS
100.0000 mg | ORAL_CAPSULE | Freq: Two times a day (BID) | ORAL | Status: DC
Start: 2021-07-22 — End: 2021-07-25
  Administered 2021-07-23 – 2021-07-25 (×4): 100 mg via ORAL
  Filled 2021-07-22 (×5): qty 1

## 2021-07-22 MED ORDER — ACETAMINOPHEN 325 MG PO TABS
650.0000 mg | ORAL_TABLET | Freq: Four times a day (QID) | ORAL | Status: DC | PRN
  Administered 2021-07-25: 650 mg via ORAL
  Filled 2021-07-22: qty 2

## 2021-07-22 MED ORDER — METHIMAZOLE 5 MG PO TABS
5.0000 mg | ORAL_TABLET | Freq: Every day | ORAL | Status: DC
  Administered 2021-07-23 – 2021-07-25 (×3): 5 mg via ORAL
  Filled 2021-07-22 (×3): qty 1

## 2021-07-22 MED ORDER — LORATADINE 10 MG PO TABS
10.0000 mg | ORAL_TABLET | Freq: Every day | ORAL | Status: DC
  Administered 2021-07-23 – 2021-07-25 (×3): 10 mg via ORAL
  Filled 2021-07-22 (×3): qty 1

## 2021-07-22 MED ORDER — ALBUTEROL SULFATE (2.5 MG/3ML) 0.083% IN NEBU: 2.5000 mg | INHALATION_SOLUTION | RESPIRATORY_TRACT | Status: DC | PRN

## 2021-07-22 MED ORDER — LISINOPRIL 20 MG PO TABS
40.0000 mg | ORAL_TABLET | Freq: Every day | ORAL | Status: DC
  Administered 2021-07-23 – 2021-07-24 (×2): 40 mg via ORAL
  Filled 2021-07-22 (×2): qty 2

## 2021-07-22 MED ORDER — IOHEXOL 350 MG/ML SOLN
75.0000 mL | Freq: Once | INTRAVENOUS | Status: AC | PRN
  Administered 2021-07-22: 75 mL via INTRAVENOUS

## 2021-07-22 MED ORDER — OXYCODONE HCL 5 MG PO TABS: 5.0000 mg | ORAL_TABLET | ORAL | Status: DC | PRN

## 2021-07-22 MED ORDER — SODIUM CHLORIDE 0.9 % IV SOLN: INTRAVENOUS | Status: AC

## 2021-07-22 MED ORDER — SODIUM CHLORIDE 0.9 % IV SOLN
2.0000 g | Freq: Two times a day (BID) | INTRAVENOUS | Status: DC
  Administered 2021-07-22 – 2021-07-25 (×6): 2 g via INTRAVENOUS
  Filled 2021-07-22 (×6): qty 12.5

## 2021-07-22 NOTE — H&P (Signed)
History and Physical    Patient: Roger Briggs [id number] DOB: 1929/06/17 DOA: 07/22/2021 DOS: the patient was seen and examined on 07/22/2021 PCP: Pcp, No  Patient coming from: ALF/ILF - Lives at [facility], [place] but not memory care.  NOK: Daughter, Pryor Ochoa, [phone]   Chief Complaint: Weakness, cough, malaise  HPI: Roger Briggs is a 88 y.o. male with medical history significant of HTN; dementia; and hyperthyroidism presenting with weakness, cough, malaise.  His daughter reports that he started yesterday with rhinorrhea, cough.  His daughter went to check on him.  He was in the lobby and he couldn't stand to walk back to his room.  They called this AM and recommended a CXR.  He is normally able to ambulate with a walker without difficulty.  He is otherwise independent with ADLs, wear Depends.  He was a remote smoker for about 10 years, but smoked a pipe for much longer, 50 years.  No known h/o COPD.  [place] has been concerned about bradycardia, had a recent monitor but has not heard the results.  He has never needed O2, does not usually have SOB.  No known sick contacts.  No fever.    ER Course:  retired OB/GYN with dementia.  Usually active, but unable to get up right now with terrible cough.  Clinical PNA, CXR negative.  CT with PNA.  Given Rocephin, Azithromycin.       Review of Systems: unable to review all systems due to the inability of the patient to answer questions.  He was somnolent and did not provide good history.   Past Medical History:  Diagnosis Date   HTN (hypertension)    Hyperthyroidism    Mild cognitive impairment    Past Surgical History:  Procedure Laterality Date   ABDOMINAL AORTIC ANEURYSM REPAIR     Social History:  reports that he quit smoking about 4 years ago. His smoking use included pipe. He has never been exposed to tobacco smoke. He has never used smokeless tobacco. He reports that he does not currently use alcohol.  He reports that he does not use drugs.  No Known Allergies  Family History  Problem Relation Age of Onset   Hypertension Daughter    Depression Daughter     Prior to Admission medications   Medication Sig Start Date End Date Taking? Authorizing Provider  acetaminophen (TYLENOL) 500 MG tablet Take 500 mg by mouth 3 (three) times daily as needed (pain, fever > 100.5).   Yes [provider]  betamethasone valerate lotion (VALISONE) 0.1 % Apply 1 application  topically 2 (two) times daily as needed (itching, eczema).   Yes [provider]  Cholecalciferol (VITAMIN D3) 50 MCG (2000 UT) capsule Take 2,000 Units by mouth daily.   Yes [provider]  Emollient (CERAVE DAILY MOISTURIZING) LOTN Apply 1 Application topically daily. 08/21/19  Yes [provider]  FEROSUL 325 (65 Fe) MG tablet Take 325 mg by mouth every Monday, Wednesday, and Friday. 04/26/21  Yes [provider]  furosemide (LASIX) 20 MG tablet Take 1 tablet (20 mg total) by mouth daily. 05/06/20 09/17/21 Yes O'Neal, Cassie Freer, MD  lisinopril (PRINIVIL,ZESTRIL) 40 MG tablet Take 40 mg by mouth at bedtime.   Yes [provider]  loratadine (CLARITIN) 10 MG tablet Take 10 mg by mouth daily. 08/06/19  Yes [provider]  methimazole (TAPAZOLE) 5 MG tablet Take 5 mg by mouth daily. 08/20/19  Yes [provider]  OVER  THE COUNTER MEDICATION Take 20 mLs by mouth every 4 (four) hours as needed (Congestion). Robitussin Maximum Strength   Yes [provider]  traZODone (DESYREL) 50 MG tablet Take 25 mg by mouth at bedtime.    Yes [provider]  vitamin B-12 (CYANOCOBALAMIN) 1000 MCG tablet Take 1,000 mcg by mouth daily. 10/23/20  Yes [provider]    Physical Exam: Vitals:   07/22/21 1215 07/22/21 1245 07/22/21 1400 07/22/21 1415  BP: (!) 142/80 (!) 152/78 127/65 115/63  Pulse: (!) 55 (!) 48 (!) 51 (!) 48  Resp: 19 (!) 25 (!) 23 (!) 24   Temp:      TempSrc:      SpO2: 96% 97% 95% 94%  Weight:      Height:       General:  Appears calm and comfortable and is in NAD, somnolent and frail Eyes:  PERRL, EOMI, normal lids, iris ENT:  grossly normal hearing, lips & tongue, mildly dry mm; some absent dentition Neck:  no LAD, masses or thyromegaly Cardiovascular:  RR with bradycardia, no m/r/g. 1+ LE edema.  Respiratory:   CTA bilaterally with no wheezes/rales/rhonchi.  Mildly increased respiratory effort. Abdomen:  soft, NT, ND Skin:  no rash or induration seen on limited exam Musculoskeletal:  grossly normal tone BUE/BLE, good ROM, no bony abnormality Psychiatric:  blunted mood and affect, speech sparse but appropriate Neurologic:  CN 2-12 grossly intact, moves all extremities in coordinated fashion   Radiological Exams on Admission: Independently reviewed - see discussion in A/P where applicable  CT Chest W Contrast  Result Date: 07/22/2021 CLINICAL DATA:  Weakness, cough and malaise with suspected pneumonia. EXAM: CT CHEST WITH CONTRAST TECHNIQUE: Multidetector CT imaging of the chest was performed during intravenous contrast administration. RADIATION DOSE REDUCTION: This exam was performed according to the departmental dose-optimization program which includes automated exposure control, adjustment of the mA and/or kV according to patient size and/or use of iterative reconstruction technique. CONTRAST:  101mL OMNIPAQUE IOHEXOL 350 MG/ML SOLN COMPARISON:  Chest x-ray earlier today. FINDINGS: Cardiovascular: Atherosclerosis and aneurysmal disease of the thoracic aorta. The ascending thoracic aorta measures up to 4.1 cm, the proximal arch measures 4 cm, the distal arch measures 3.8 cm and the descending thoracic aorta measures up to 3.8 cm. The heart is mild-to-moderately enlarged. Coronary atherosclerosis present with calcified plaque in a 3 vessel distribution. No pericardial fluid identified. Central pulmonary arteries are dilated  with the main pulmonary artery measuring up to 3.8 cm. Reflux of contrast into the IVC and hepatic veins is consistent with some degree of right heart failure which is also supported by right ventricular dilatation. Mediastinum/Nodes: No enlarged mediastinal, hilar, or axillary lymph nodes. Thyroid gland, trachea, and esophagus demonstrate no significant findings. Lungs/Pleura: There is some respiratory motion. Bronchial thickening and some surrounding airspace opacity in the left lower lobe is consistent with bronchitis and potentially additional pneumonia. Underlying chronic lung disease suspected. No overt edema, pleural fluid or pneumothorax. Upper Abdomen: No acute abnormality. Musculoskeletal: Osteopenia. No fractures or bony lesions identified. IMPRESSION: 1. Suspect bronchitis and pneumonia in the left lower lobe. 2. Atherosclerosis and diffuse aneurysmal disease of the thoracic aorta. The ascending thoracic aorta measures up to 4.1 cm and the arch measures up to 4 cm. Recommend semi-annual imaging followup by CTA or MRA and referral to cardiothoracic surgery if not already obtained. This recommendation follows 2010 ACCF/AHA/AATS/ACR/ASA/SCA/SCAI/SIR/STS/SVM Guidelines for the Diagnosis and Management of Patients With Thoracic Aortic Disease. Circulation. 2010; [id number]:4281962.  Aortic aneurysm NOS (ICD10-I71.9) 3. Dilated central pulmonary arteries are consistent with underlying pulmonary hypertension and there also is evidence of right heart failure. 4. Coronary atherosclerosis with calcified plaque in a 3 vessel distribution. Aortic aneurysm NOS (ICD10-I71.9). Electronically Signed   By: Aletta Edouard M.D.   On: 07/22/2021 14:27   DG Chest Port 1 View  Result Date: 07/22/2021 CLINICAL DATA:  Questionable sepsis EXAM: PORTABLE CHEST 1 VIEW COMPARISON:  01/06/2019 FINDINGS: Cardiomegaly. Pulmonary vascular prominence without overt edema or acute airspace opacity. The visualized skeletal structures are  unremarkable. IMPRESSION: Cardiomegaly and pulmonary vascular prominence without overt edema or acute airspace opacity. Electronically Signed   By: Delanna Ahmadi M.D.   On: 07/22/2021 09:26    EKG: Independently reviewed.  Afib with rate 54; nonspecific ST changes with no evidence of acute ischemia   Labs on Admission: I have personally reviewed the available labs and imaging studies at the time of the admission.  Pertinent labs:    Albumin 3.2 Lactate 1.3 WBC 8.2 Hgb 11.2 Platelets 78   Assessment and Plan: Principal Problem:   CAP (community acquired pneumonia) Active Problems:   Hyperthyroidism   Sinus bradycardia   Dementia without behavioral disturbance ([place])   Essential hypertension   Thoracic aortic aneurysm ([place])   DNR (do not resuscitate)    PNA -Patient presenting with productive cough,mildly decreased oxygen saturation, and infiltrate in left lower lobe on chest x-ray -This appears to be most likely community-acquired pneumonia.  -Aspiration is also a consideration; his daughter reports coughing while eating, particularly if he tries to also have a conversation. -Influenza negative. -COVID-19 negative. -Will order lower respiratory tract procalcitonin level.   >0.5 indicates infection and >>0.5 indicates more serious disease.  As the procalcitonin level normalizes, it will be reasonable to consider de-escalation of antibiotic coverage.  The sensitivity of procalcitonin is variable and should not be used alone to guide treatment. -CURB-65 score is 2 - will admit the patient to telemetry -Pneumonia Severity Index (PSI) is Class 4, 9% mortality. -Will start Azithromycin 500 mg IV daily and Cefepime - to cover for CAP as well as aspiration -Will add albuterol PRN -Will add Mucinex for cough -Will admit on telemetry bed  Bradycardia -Patient with bradycardia now and at his facility -He recently had an ambulatory monitor long-term and is awaiting results -He does not  appear to be taking medications that would cause this issue -Will monitor on telemetry  Dementia -Delirium precautions -Continue trazodone for sleep -He doesn't appear to be taking medications for this issue at this time  Hyperthyroidism -Continue methimazole  HTN -Continue lisinopril  AAA -Appreciated on imaging -Needs semi-annual imaging and/or vascular f/u  DNR -I have discussed code status with the patient's daughter and the patient would not desire resuscitation and would prefer to die a natural death should that situation arise. -He will need a gold out of facility DNR form at the time of discharge     Advance Care Planning:   Code Status: DNR   Consults: RT; TOC team; PT/OT  DVT Prophylaxis: SCDs (thrombocytopenia)  Family Communication: Daughter was present throughout evaluation  Severity of Illness: The appropriate patient status for this patient is INPATIENT. Inpatient status is judged to be reasonable and necessary in order to provide the required intensity of service to ensure the patient's safety. The patient's presenting symptoms, physical exam findings, and initial radiographic and laboratory data in the context of their chronic comorbidities is felt to place them at high  risk for further clinical deterioration. Furthermore, it is not anticipated that the patient will be medically stable for discharge from the hospital within 2 midnights of admission.   * I certify that at the point of admission it is my clinical judgment that the patient will require inpatient hospital care spanning beyond 2 midnights from the point of admission due to high intensity of service, high risk for further deterioration and high frequency of surveillance required.*  Author: Karmen Bongo, MD 07/22/2021 5:00 PM  For on call review [email or web address].

## 2021-07-22 NOTE — Progress Notes (Signed)
PHARMACY ANTIBIOTIC CONSULT NOTE   Roger Briggs a 88 y.o. male admitted on 07/22/2021 with rhinorrhea/cough/weaknes .  Pharmacy has been consulted for cefepime dosing.  07/22/2021: Scr 1.05, LA 1.3,  WBC 8.2  Vital Signs: afebrile, HR slow (40s-60s), BP WNL  Estimated Creatinine Clearance: 54.7 mL/min (by C-G formula based on SCr of 1.05 mg/dL).  Plan: START Cefepime 2g IV Q12H CONTINUE Azithromycin 500 mg IV daily per MD  Monitor renal function, clinical status, C/S, de-escalation   Allergies:  No Known Allergies  Filed Weights   07/22/21 0912  Weight: 97 kg (213 lb 13.5 oz)       Latest Ref Rng & Units 07/22/2021    9:15 AM 09/10/2019    2:31 PM  CBC  WBC 4.0 - 10.5 K/uL 8.2  4.7   Hemoglobin 13.0 - 17.0 g/dL 62.0  35.5   Hematocrit 39.0 - 52.0 % 33.4  36.1   Platelets 150 - 400 K/uL 78  128     Antimicrobials this admission: Cefepime 07/22/2021>>  Azithromycin 07/22/2021>> CTX 6/9 x1   Microbiology results: 07/22/2021 Bcx: sent 07/22/2021 Resp Cx: sent   Thank you for allowing pharmacy to be a part of this patient's care.  Jani Gravel, PharmD PGY-1 Acute Care Resident  07/22/2021 4:16 PM

## 2021-07-22 NOTE — ED Provider Notes (Signed)
[facility] EMERGENCY DEPARTMENT Provider Note   CSN: [id number] Arrival date & time: 07/22/21  5638     History {Add pertinent medical, surgical, social history, OB history to HPI:1} Chief Complaint  Patient presents with   Cough   Weakness    Roger Briggs is a 88 y.o. male.  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  He has a pmhx significant for afib (not on blood thinners due to frequent falls), HF, HTN, hyperthyroidism, and dementia.  Pt presents to the ED today with a cough and weakness.  Sx have been going on for a few days according to EMS.  Pt does not know why he is here.  He said he feels ok. Pt's daughter said he's been weak a few days.  He normally gets up and walks around a lot, but has been staying in bed.  She said he has not had much of an appetite.       Home Medications Prior to Admission medications   Medication Sig Start Date End Date Taking? Authorizing Provider  acetaminophen (TYLENOL) 500 MG tablet Take 500 mg by mouth 3 (three) times daily as needed (pain, fever > 100.5).   Yes [provider]  betamethasone valerate lotion (VALISONE) 0.1 % Apply 1 application  topically 2 (two) times daily as needed (itching, eczema).   Yes [provider]  Cholecalciferol (VITAMIN D3) 50 MCG (2000 UT) capsule Take 2,000 Units by mouth daily.   Yes [provider]  Emollient (CERAVE DAILY MOISTURIZING) LOTN Apply 1 Application topically daily. 08/21/19  Yes [provider]  FEROSUL 325 (65 Fe) MG tablet Take 325 mg by mouth every Monday, Wednesday, and Friday. 04/26/21  Yes [provider]  furosemide (LASIX) 20 MG tablet Take 1 tablet (20 mg total) by mouth daily. 05/06/20 09/17/21 Yes O'Neal, Ronnald Ramp, MD  lisinopril (PRINIVIL,ZESTRIL) 40 MG tablet Take 40 mg by mouth at bedtime.   Yes [provider]  loratadine (CLARITIN) 10 MG tablet Take 10 mg by mouth daily. 08/06/19  Yes [provider]   methimazole (TAPAZOLE) 5 MG tablet Take 5 mg by mouth daily. 08/20/19  Yes [provider]  OVER THE COUNTER MEDICATION Take 20 mLs by mouth every 4 (four) hours as needed (Congestion). Robitussin Maximum Strength   Yes [provider]  traZODone (DESYREL) 50 MG tablet Take 25 mg by mouth at bedtime.    Yes [provider]  vitamin B-12 (CYANOCOBALAMIN) 1000 MCG tablet Take 1,000 mcg by mouth daily. 10/23/20  Yes [provider]      Allergies    Patient has no known allergies.    Review of Systems   Review of Systems  Respiratory:  Positive for cough.   All other systems reviewed and are negative.   Physical Exam Updated Vital Signs BP 138/80   Pulse (!) 54   Temp 98.6 F (37 C) (Oral)   Resp (!) 24   Ht 6' 1.5" (1.867 m)   Wt 97 kg   SpO2 94%   BMI 27.83 kg/m  Physical Exam Vitals and nursing note reviewed.  Constitutional:      Appearance: Normal appearance.  HENT:     Head: Normocephalic and atraumatic.     Right Ear: External ear normal.     Left Ear: External ear normal.     Nose: Nose normal.     Mouth/Throat:     Mouth: Mucous membranes are moist.  Pharynx: Oropharynx is clear.  Eyes:     Extraocular Movements: Extraocular movements intact.     Conjunctiva/sclera: Conjunctivae normal.     Pupils: Pupils are equal, round, and reactive to light.  Cardiovascular:     Rate and Rhythm: Bradycardia present. Rhythm irregular.     Pulses: Normal pulses.     Heart sounds: Normal heart sounds.  Pulmonary:     Effort: Pulmonary effort is normal.     Breath sounds: Rhonchi present.  Abdominal:     General: Abdomen is flat. Bowel sounds are normal.     Palpations: Abdomen is soft.  Musculoskeletal:        General: Normal range of motion.     Cervical back: Normal range of motion and neck supple.     Right lower leg: Edema present.     Left lower leg: Edema present.     Comments: Edema is chronic per daughter  Skin:     General: Skin is warm.     Capillary Refill: Capillary refill takes less than 2 seconds.  Neurological:     General: No focal deficit present.     Mental Status: He is alert and oriented to person, place, and time.  Psychiatric:        Mood and Affect: Mood normal.        Behavior: Behavior normal.     ED Results / Procedures / Treatments   Labs (all labs ordered are listed, but only abnormal results are displayed) Labs Reviewed  COMPREHENSIVE METABOLIC PANEL - Abnormal; Notable for the following components:      Result Value   Total Protein 6.2 (*)    Albumin 3.2 (*)    All other components within normal limits  CBC WITH DIFFERENTIAL/PLATELET - Abnormal; Notable for the following components:   RBC 3.53 (*)    Hemoglobin 11.2 (*)    HCT 33.4 (*)    Platelets 78 (*)    All other components within normal limits  PROTIME-INR - Abnormal; Notable for the following components:   Prothrombin Time 16.9 (*)    INR 1.4 (*)    All other components within normal limits  SARS CORONAVIRUS 2 BY RT PCR  CULTURE, BLOOD (ROUTINE X 2)  CULTURE, BLOOD (ROUTINE X 2)  LACTIC ACID, PLASMA  APTT  LACTIC ACID, PLASMA  URINALYSIS, ROUTINE W REFLEX MICROSCOPIC    EKG EKG Interpretation  Date/Time:  Friday July 22 2021 09:07:27 EDT Ventricular Rate:  54 PR Interval:    QRS Duration: 102 QT Interval:  469 QTC Calculation: 445 R Axis:   -16 Text Interpretation: Atrial fibrillation Borderline left axis deviation RSR' in V1 or V2, probably normal variant Minimal ST depression, diffuse leads No significant change since last tracing Confirmed by Jacalyn LefevreHaviland, Gaige Sebo [phone]([id number]) on 07/22/2021 9:29:05 AM  Radiology DG Chest Port 1 View  Result Date: 07/22/2021 CLINICAL DATA:  Questionable sepsis EXAM: PORTABLE CHEST 1 VIEW COMPARISON:  01/06/2019 FINDINGS: Cardiomegaly. Pulmonary vascular prominence without overt edema or acute airspace opacity. The visualized skeletal structures are unremarkable. IMPRESSION:  Cardiomegaly and pulmonary vascular prominence without overt edema or acute airspace opacity. Electronically Signed   By: Jearld LeschAlex D Bibbey M.D.   On: 07/22/2021 09:26    Procedures Procedures  {Document cardiac monitor, telemetry assessment procedure when appropriate:1}  Medications Ordered in ED Medications  cefTRIAXone (ROCEPHIN) 1 g in sodium chloride 0.9 % 100 mL IVPB (1 g Intravenous New Bag/Given 07/22/21 1043)  azithromycin (ZITHROMAX) 500 mg in sodium chloride  0.9 % 250 mL IVPB (has no administration in time range)  0.9 %  sodium chloride infusion (has no administration in time range)    ED Course/ Medical Decision Making/ A&P                           Medical Decision Making Amount and/or Complexity of Data Reviewed Labs: ordered. Radiology: ordered. ECG/medicine tests: ordered.  Risk Prescription drug management.   This patient presents to the ED for concern of cough, this involves an extensive number of treatment options, and is a complaint that carries with it a high risk of complications and morbidity.  The differential diagnosis includes sepsis, pna, uri, covid, chf   Co morbidities that complicate the patient evaluation  afib (not on blood thinners due to frequent falls), HF, HTN, hyperthyroidism, and dementia   Additional history obtained:  Additional history obtained from epic chart review External records from outside source obtained and reviewed including daughter   Lab Tests:  I Ordered, and personally interpreted labs.  The pertinent results include:  cbc with mild anemia (hgb 11.2), cmp nl; inr 1.4; covid neg, lactic 1.3   Imaging Studies ordered:  I ordered imaging studies including CXR  I independently visualized and interpreted imaging which showed  IMPRESSION:  Cardiomegaly and pulmonary vascular prominence without overt edema  or acute airspace opacity.   I agree with the radiologist interpretation   Cardiac Monitoring:  The patient was  maintained on a cardiac monitor.  I personally viewed and interpreted the cardiac monitored which showed an underlying rhythm of: afib   Medicines ordered and prescription drug management:  I ordered medication including rocephin and zithromax  for pna  Reevaluation of the patient after these medicines showed that the patient improved I have reviewed the patients home medicines and have made adjustments as needed   Test Considered:  ***   Critical Interventions:  ***   Consultations Obtained:  I requested consultation with the ***,  and discussed lab and imaging findings as well as pertinent plan - they recommend: ***   Problem List / ED Course:  ***   Reevaluation:  After the interventions noted above, I reevaluated the patient and found that they have :{resolved/improved/worsened:23923::"improved"}   Social Determinants of Health:  ***   Dispostion:  After consideration of the diagnostic results and the patients response to treatment, I feel that the patent would benefit from ***.    {Document critical care time when appropriate:1} {Document review of labs and clinical decision tools ie heart score, Chads2Vasc2 etc:1}  {Document your independent review of radiology images, and any outside records:1} {Document your discussion with family members, caretakers, and with consultants:1} {Document social determinants of health affecting pt's care:1} {Document your decision making why or why not admission, treatments were needed:1} Final Clinical Impression(s) / ED Diagnoses Final diagnoses:  None    Rx / DC Orders ED Discharge Orders     None

## 2021-07-22 NOTE — ED Notes (Signed)
ED TO INPATIENT HANDOFF REPORT  ED Nurse Name and Phone #: Deneise Lever [phone]  S Name/Age/Gender Roger Briggs 88 y.o. male Room/Bed: 016C/016C  Code Status   Code Status: DNR  Home/SNF/Other Nursing Home Patient oriented to: self and place Is this baseline? Yes   Triage Complete: Triage complete  Chief Complaint CAP (community acquired pneumonia) [J18.9]  Triage Note Pt BIB [facility] for eval of weakness, cough and general malaise. Pt comes from [facility], has had a few days worth of progressively worsening cough. Pt w/ rhonchi. Afebrile.    Allergies No Known Allergies  Level of Care/Admitting Diagnosis ED Disposition     ED Disposition  Admit   Condition  --   Comment  Hospital Area: [place] [[id number]]  Level of Care: Telemetry Medical [104]  May admit patient to [facility] or [facility] if equivalent level of care is available:: Yes  Covid Evaluation: Asymptomatic - no recent exposure (last 10 days) testing not required  Diagnosis: CAP (community acquired pneumonia) [id number]  Admitting Physician: Karmen Bongo [[id number]]  Attending Physician: Karmen Bongo [[id number]]  Estimated length of stay: past midnight tomorrow  Certification:: I certify this patient will need inpatient services for at least 2 midnights          B Medical/Surgery History Past Medical History:  Diagnosis Date   HTN (hypertension)    Hyperthyroidism    Mild cognitive impairment    Past Surgical History:  Procedure Laterality Date   ABDOMINAL AORTIC ANEURYSM REPAIR       A IV Location/Drains/Wounds Patient Lines/Drains/Airways Status     Active Line/Drains/Airways     Name Placement date Placement time Site Days   Peripheral IV 07/22/21 20 G Distal;Posterior;Right Forearm 07/22/21  0908  Forearm  less than 1            Intake/Output Last 24 hours  Intake/Output Summary (Last 24 hours) at 07/22/2021 1606 Last data filed at 07/22/2021  1314 Gross per 24 hour  Intake 350 ml  Output --  Net 350 ml    Labs/Imaging Results for orders placed or performed during the hospital encounter of 07/22/21 (from the past 48 hour(s))  Lactic acid, plasma     Status: None   Collection Time: 07/22/21  9:15 AM  Result Value Ref Range   Lactic Acid, Venous 1.3 0.5 - 1.9 mmol/L    Comment: Performed at [facility], [address] [place], [place] [address]  Comprehensive metabolic panel     Status: Abnormal   Collection Time: 07/22/21  9:15 AM  Result Value Ref Range   Sodium 139 135 - 145 mmol/L   Potassium 3.6 3.5 - 5.1 mmol/L   Chloride 106 98 - 111 mmol/L   CO2 25 22 - 32 mmol/L   Glucose, Bld 99 70 - 99 mg/dL    Comment: Glucose reference range applies only to samples taken after fasting for at least 8 hours.   BUN 14 8 - 23 mg/dL   Creatinine, Ser 1.05 0.61 - 1.24 mg/dL   Calcium 9.2 8.9 - 10.3 mg/dL   Total Protein 6.2 (L) 6.5 - 8.1 g/dL   Albumin 3.2 (L) 3.5 - 5.0 g/dL   AST 24 15 - 41 U/L   ALT 18 0 - 44 U/L   Alkaline Phosphatase 56 38 - 126 U/L   Total Bilirubin 1.1 0.3 - 1.2 mg/dL   GFR, Estimated >60 >60 mL/min    Comment: (NOTE) Calculated  using the CKD-EPI Creatinine Equation (2021)    Anion gap 8 5 - 15    Comment: Performed at [facility], [place] [address] [place], [place] [address]  CBC with Differential     Status: Abnormal   Collection Time: 07/22/21  9:15 AM  Result Value Ref Range   WBC 8.2 4.0 - 10.5 K/uL   RBC 3.53 (L) 4.22 - 5.81 MIL/uL   Hemoglobin 11.2 (L) 13.0 - 17.0 g/dL   HCT 33.4 (L) 39.0 - 52.0 %   MCV 94.6 80.0 - 100.0 fL   MCH 31.7 26.0 - 34.0 pg   MCHC 33.5 30.0 - 36.0 g/dL   RDW 12.8 11.5 - 15.5 %   Platelets 78 (L) 150 - 400 K/uL    Comment: SPECIMEN CHECKED FOR CLOTS Immature Platelet Fraction may be clinically indicated, consider ordering this additional test [id number] REPEATED TO VERIFY PLATELET COUNT CONFIRMED BY SMEAR    nRBC 0.0 0.0 - 0.2 %   Neutrophils  Relative % 83 %   Neutro Abs 6.9 1.7 - 7.7 K/uL   Lymphocytes Relative 9 %   Lymphs Abs 0.7 0.7 - 4.0 K/uL   Monocytes Relative 7 %   Monocytes Absolute 0.5 0.1 - 1.0 K/uL   Eosinophils Relative 0 %   Eosinophils Absolute 0.0 0.0 - 0.5 K/uL   Basophils Relative 0 %   Basophils Absolute 0.0 0.0 - 0.1 K/uL   Immature Granulocytes 1 %   Abs Immature Granulocytes 0.06 0.00 - 0.07 K/uL    Comment: Performed at [facility], [place] [address], [place], [place] [address]  Protime-INR     Status: Abnormal   Collection Time: 07/22/21  9:15 AM  Result Value Ref Range   Prothrombin Time 16.9 (H) 11.4 - 15.2 seconds   INR 1.4 (H) 0.8 - 1.2    Comment: (NOTE) INR goal varies based on device and disease states. Performed at [facility], [place] [address] [place], [place] [address]   APTT     Status: None   Collection Time: 07/22/21  9:15 AM  Result Value Ref Range   aPTT 32 24 - 36 seconds    Comment: Performed at [place] [address] [place], [place] [address]  SARS Coronavirus 2 by RT PCR (hospital order, performed in [facility] hospital lab) *cepheid single result test* Anterior Nasal Swab     Status: None   Collection Time: 07/22/21  9:20 AM   Specimen: Anterior Nasal Swab  Result Value Ref Range   SARS Coronavirus 2 by RT PCR NEGATIVE NEGATIVE    Comment: (NOTE) SARS-CoV-2 target nucleic acids are NOT DETECTED.  The SARS-CoV-2 RNA is generally detectable in upper and lower respiratory specimens during the acute phase of infection. The lowest concentration of SARS-CoV-2 viral copies this assay can detect is 250 copies / mL. A negative result does not preclude SARS-CoV-2 infection and should not be used as the sole basis for treatment or other patient management decisions.  A negative result may occur with improper specimen collection / handling, submission of specimen other than nasopharyngeal swab, presence of viral mutation(s) within the areas targeted by  this assay, and inadequate number of viral copies (<250 copies / mL). A negative result must be combined with clinical observations, patient history, and epidemiological information.  Fact Sheet for Patients:   https://www.patel.info/  Fact Sheet for Healthcare Providers: https://hall.com/  This test is not yet approved or  cleared  by the [place] and has been authorized for detection and/or diagnosis of SARS-CoV-2 by FDA under an Emergency Use Authorization (EUA).  This EUA will remain in effect (meaning this test can be used) for the duration of the COVID-19 declaration under Section 564(b)(1) of the Act, 21 U.S.C. section 360bbb-3(b)(1), unless the authorization is terminated or revoked sooner.  Performed at [facility], [place] [address], [place], [place] [address]    CT Chest W Contrast  Result Date: 07/22/2021 CLINICAL DATA:  Weakness, cough and malaise with suspected pneumonia. EXAM: CT CHEST WITH CONTRAST TECHNIQUE: Multidetector CT imaging of the chest was performed during intravenous contrast administration. RADIATION DOSE REDUCTION: This exam was performed according to the departmental dose-optimization program which includes automated exposure control, adjustment of the mA and/or kV according to patient size and/or use of iterative reconstruction technique. CONTRAST:  6mL OMNIPAQUE IOHEXOL 350 MG/ML SOLN COMPARISON:  Chest x-ray earlier today. FINDINGS: Cardiovascular: Atherosclerosis and aneurysmal disease of the thoracic aorta. The ascending thoracic aorta measures up to 4.1 cm, the proximal arch measures 4 cm, the distal arch measures 3.8 cm and the descending thoracic aorta measures up to 3.8 cm. The heart is mild-to-moderately enlarged. Coronary atherosclerosis present with calcified plaque in a 3 vessel distribution. No pericardial fluid identified. Central pulmonary arteries are dilated with the main pulmonary artery  measuring up to 3.8 cm. Reflux of contrast into the IVC and hepatic veins is consistent with some degree of right heart failure which is also supported by right ventricular dilatation. Mediastinum/Nodes: No enlarged mediastinal, hilar, or axillary lymph nodes. Thyroid gland, trachea, and esophagus demonstrate no significant findings. Lungs/Pleura: There is some respiratory motion. Bronchial thickening and some surrounding airspace opacity in the left lower lobe is consistent with bronchitis and potentially additional pneumonia. Underlying chronic lung disease suspected. No overt edema, pleural fluid or pneumothorax. Upper Abdomen: No acute abnormality. Musculoskeletal: Osteopenia. No fractures or bony lesions identified. IMPRESSION: 1. Suspect bronchitis and pneumonia in the left lower lobe. 2. Atherosclerosis and diffuse aneurysmal disease of the thoracic aorta. The ascending thoracic aorta measures up to 4.1 cm and the arch measures up to 4 cm. Recommend semi-annual imaging followup by CTA or MRA and referral to cardiothoracic surgery if not already obtained. This recommendation follows 2010 ACCF/AHA/AATS/ACR/ASA/SCA/SCAI/SIR/STS/SVM Guidelines for the Diagnosis and Management of Patients With Thoracic Aortic Disease. Circulation. 2010; [id number]. Aortic aneurysm NOS (ICD10-I71.9) 3. Dilated central pulmonary arteries are consistent with underlying pulmonary hypertension and there also is evidence of right heart failure. 4. Coronary atherosclerosis with calcified plaque in a 3 vessel distribution. Aortic aneurysm NOS (ICD10-I71.9). Electronically Signed   By: Aletta Edouard M.D.   On: 07/22/2021 14:27   DG Chest Port 1 View  Result Date: 07/22/2021 CLINICAL DATA:  Questionable sepsis EXAM: PORTABLE CHEST 1 VIEW COMPARISON:  01/06/2019 FINDINGS: Cardiomegaly. Pulmonary vascular prominence without overt edema or acute airspace opacity. The visualized skeletal structures are unremarkable. IMPRESSION:  Cardiomegaly and pulmonary vascular prominence without overt edema or acute airspace opacity. Electronically Signed   By: Delanna Ahmadi M.D.   On: 07/22/2021 09:26    Pending Labs Unresulted Labs (From admission, onward)     Start     Ordered   07/23/21 [id number]  Basic metabolic panel  Tomorrow morning,   R        07/22/21 1543   07/23/21 0500  CBC  Tomorrow morning,   R        07/22/21 1543   07/22/21  1542  Expectorated Sputum Assessment w Gram Stain, Rflx to Resp Cult  (COPD / Pneumonia / Cellulitis / Lower Extremity Wound)  Once,   R        07/22/21 1543   07/22/21 1542  Strep pneumoniae urinary antigen  (COPD / Pneumonia / Cellulitis / Lower Extremity Wound)  Once,   R        07/22/21 1543   07/22/21 0911  Blood Culture (routine x 2)  (Undifferentiated presentation (screening labs and basic nursing orders))  BLOOD CULTURE X 2,   STAT      07/22/21 0911            Vitals/Pain Today's Vitals   07/22/21 1215 07/22/21 1245 07/22/21 1400 07/22/21 1415  BP: (!) 142/80 (!) 152/78 127/65 115/63  Pulse: (!) 55 (!) 48 (!) 51 (!) 48  Resp: 19 (!) 25 (!) 23 (!) 24  Temp:      TempSrc:      SpO2: 96% 97% 95% 94%  Weight:      Height:      PainSc:        Isolation Precautions No active isolations  Medications Medications  azithromycin (ZITHROMAX) 500 mg in sodium chloride 0.9 % 250 mL IVPB (0 mg Intravenous Stopped 07/22/21 1314)  lisinopril (ZESTRIL) tablet 40 mg (has no administration in time range)  traZODone (DESYREL) tablet 25 mg (has no administration in time range)  methimazole (TAPAZOLE) tablet 5 mg (has no administration in time range)  loratadine (CLARITIN) tablet 10 mg (has no administration in time range)  enoxaparin (LOVENOX) injection 40 mg (has no administration in time range)  0.9 %  sodium chloride infusion (has no administration in time range)  acetaminophen (TYLENOL) tablet 650 mg (has no administration in time range)    Or  acetaminophen (TYLENOL) suppository  650 mg (has no administration in time range)  morphine (PF) 2 MG/ML injection 2 mg (has no administration in time range)  docusate sodium (COLACE) capsule 100 mg (has no administration in time range)  polyethylene glycol (MIRALAX / GLYCOLAX) packet 17 g (has no administration in time range)  bisacodyl (DULCOLAX) EC tablet 5 mg (has no administration in time range)  ondansetron (ZOFRAN) tablet 4 mg (has no administration in time range)    Or  ondansetron (ZOFRAN) injection 4 mg (has no administration in time range)  albuterol (PROVENTIL) (2.5 MG/3ML) 0.083% nebulizer solution 2.5 mg (has no administration in time range)  guaiFENesin (MUCINEX) 12 hr tablet 600 mg (has no administration in time range)  hydrALAZINE (APRESOLINE) injection 5 mg (has no administration in time range)  sodium chloride flush (NS) 0.9 % injection 3 mL (has no administration in time range)  oxyCODONE (Oxy IR/ROXICODONE) immediate release tablet 5 mg (has no administration in time range)  cefTRIAXone (ROCEPHIN) 1 g in sodium chloride 0.9 % 100 mL IVPB (0 g Intravenous Stopped 07/22/21 1113)  0.9 %  sodium chloride infusion ( Intravenous New Bag/Given 07/22/21 1214)  iohexol (OMNIPAQUE) 350 MG/ML injection 75 mL (75 mLs Intravenous Contrast Given 07/22/21 1308)    Mobility non-ambulatory Moderate fall risk   Focused Assessments Neuro Assessment Handoff:  Swallow screen pass?  NA Cardiac Rhythm: Atrial fibrillation       Neuro Assessment: Exceptions to WDL Neuro Checks:      Last Documented NIHSS Modified Score:   Has TPA been given? No If patient is a Neuro Trauma and patient is going to OR before floor call report to 4N Charge nurse:  [phone] or [phone]   R Recommendations: See Admitting Provider Note  Report given to:   Additional Notes: Pt BIB from SNF for CXR in ED> Pt found to have pneumonia. Treated w/ rocephin and azithro in the ED, going on normal saline at 125/hr. No acute complaints at this  time, has been resting comfortably.

## 2021-07-22 NOTE — ED Triage Notes (Signed)
Pt BIB [facility] for eval of weakness, cough and general malaise. Pt comes from [facility], has had a few days worth of progressively worsening cough. Pt w/ rhonchi. Afebrile.

## 2021-07-23 DIAGNOSIS — I10 Essential (primary) hypertension: Secondary | ICD-10-CM | POA: Diagnosis not present

## 2021-07-23 DIAGNOSIS — J189 Pneumonia, unspecified organism: Secondary | ICD-10-CM | POA: Diagnosis not present

## 2021-07-23 DIAGNOSIS — D696 Thrombocytopenia, unspecified: Secondary | ICD-10-CM | POA: Diagnosis present

## 2021-07-23 DIAGNOSIS — Z66 Do not resuscitate: Secondary | ICD-10-CM | POA: Diagnosis not present

## 2021-07-23 DIAGNOSIS — F0390 Unspecified dementia without behavioral disturbance: Secondary | ICD-10-CM | POA: Diagnosis not present

## 2021-07-23 LAB — CBC
HCT: 32 % — ABNORMAL LOW (ref 39.0–52.0)
Hemoglobin: 10.6 g/dL — ABNORMAL LOW (ref 13.0–17.0)
MCH: 31.5 pg (ref 26.0–34.0)
MCHC: 33.1 g/dL (ref 30.0–36.0)
MCV: 95.2 fL (ref 80.0–100.0)
Platelets: 79 10*3/uL — ABNORMAL LOW (ref 150–400)
RBC: 3.36 MIL/uL — ABNORMAL LOW (ref 4.22–5.81)
RDW: 13 % (ref 11.5–15.5)
WBC: 4.4 10*3/uL (ref 4.0–10.5)
nRBC: 0 % (ref 0.0–0.2)

## 2021-07-23 LAB — BASIC METABOLIC PANEL
Anion gap: 4 — ABNORMAL LOW (ref 5–15)
BUN: 13 mg/dL (ref 8–23)
CO2: 28 mmol/L (ref 22–32)
Calcium: 9.1 mg/dL (ref 8.9–10.3)
Chloride: 109 mmol/L (ref 98–111)
Creatinine, Ser: 1.05 mg/dL (ref 0.61–1.24)
GFR, Estimated: >60 mL/min (ref >60)
Glucose, Bld: 96 mg/dL (ref 70–99)
Potassium: 3.9 mmol/L (ref 3.5–5.1)
Sodium: 141 mmol/L (ref 135–145)

## 2021-07-23 NOTE — Evaluation (Signed)
Clinical/Bedside Swallow Evaluation Patient Details  Name: Roger Briggs MRN: [id number] Date of Birth: 1929-05-20  Today's Date: 07/23/2021 Time: SLP Start Time (ACUTE ONLY): 0905 SLP Stop Time (ACUTE ONLY): 0925 SLP Time Calculation (min) (ACUTE ONLY): 20 min  Past Medical History:  Past Medical History:  Diagnosis Date   HTN (hypertension)    Hyperthyroidism    Mild cognitive impairment    Past Surgical History:  Past Surgical History:  Procedure Laterality Date   ABDOMINAL AORTIC ANEURYSM REPAIR     HPI:  88 y/o male admitted after progressive weakness, cough and malaise at his ALF. Chest CT shows PNA. PMH: HTN, dementia, hyperthyroidism, and hx of AAA repair    Assessment / Plan / Recommendation  Clinical Impression  Pt with hx of episodic coughing and throat clearing with PO consumption per discussion with patients daughter. This date pt was without overt s/sx of aspiration with any POs (inluding 3 oz water challenge). He however does appear higher risk for aspiration given hx of dementia with cognitive impairment, decreased cleanliness of oral hygiene with dentition in poor condition, and hx of episodic difficulty per family report. Recommend mechanical soft consistencies with thin liquids and meds in puree as well as safe swallowing strategies including full supervision from staff for all meals. SLP provided dilgent oral care and encouraged oral care BID minimum to assist reduced infection risk. Will follow up for diet tolerance and need for any future instrumental swallow assessment. SLP Visit Diagnosis: Dysphagia, unspecified (R13.10)    Aspiration Risk  Mild aspiration risk;Moderate aspiration risk    Diet Recommendation   Mechanical soft, thin liquids  Medication Administration: Whole meds with puree    Other  Recommendations Oral Care Recommendations: Oral care BID    Recommendations for follow up therapy are one component of a multi-disciplinary discharge planning  process, led by the attending physician.  Recommendations may be updated based on patient status, additional functional criteria and insurance authorization.  Follow up Recommendations Other (comment) (TBD)      Assistance Recommended at Discharge Frequent or constant Supervision/Assistance  Functional Status Assessment Patient has had a recent decline in their functional status and demonstrates the ability to make significant improvements in function in a reasonable and predictable amount of time.  Frequency and Duration min 1 x/week  1 week       Prognosis Prognosis for Safe Diet Advancement: Good Barriers to Reach Goals: Cognitive deficits      Swallow Study   General Date of Onset: 07/22/21 HPI: 88 y/o male admitted after progressive weakness, cough and malaise at his ALF. Chest CT shows PNA. PMH: HTN, dementia, hyperthyroidism, and hx of AAA repair Type of Study: Bedside Swallow Evaluation Previous Swallow Assessment: none on file Diet Prior to this Study: NPO Temperature Spikes Noted: No Respiratory Status: Room air History of Recent Intubation: No Behavior/Cognition: Alert;Cooperative (hx dementia) Oral Cavity Assessment: Dry Oral Care Completed by SLP: Yes Oral Cavity - Dentition: Poor condition;Missing dentition (broken teeth, primarily edentulous upper) Vision: Functional for self-feeding Self-Feeding Abilities: Needs assist Patient Positioning: Upright in chair Baseline Vocal Quality: Normal Volitional Swallow: Able to elicit    Oral/Motor/Sensory Function Overall Oral Motor/Sensory Function: Within functional limits   Ice Chips Ice chips: Within functional limits Presentation: Spoon   Thin Liquid Thin Liquid: Impaired Presentation: Cup;Straw Oral Phase Functional Implications: Prolonged oral transit Pharyngeal  Phase Impairments: Suspected delayed Swallow;Multiple swallows    Nectar Thick Nectar Thick Liquid: Not tested   Honey Thick Honey Thick  Liquid: Not  tested   Puree Puree: Within functional limits Presentation: Spoon   Solid     Solid: Impaired Presentation: Self Fed Oral Phase Impairments: Reduced lingual movement/coordination Oral Phase Functional Implications: Prolonged oral transit;Oral residue Pharyngeal Phase Impairments: Suspected delayed Swallow;Multiple swallows      Marielys Trinidad H. MA, CCC-SLP Acute Rehabilitation Services   07/23/2021,9:52 AM

## 2021-07-23 NOTE — Progress Notes (Addendum)
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  Onset a few days prior associated with decreased ability to ambulate.  No known sick contacts.  At baseline able to ambulate with a walker without difficulty.  Remote history of tobacco abuse but no known diagnosis of COPD.  Denied fever.  In the ED, temperature 98.6 F, HR 61, RR 22, BP 139/75, SPO2 94% on room air.  WBC 8.2, hemoglobin 11.2, platelets 78.  Sodium 139, potassium 3.6, chloride 106, CO2 25, glucose 99, BUN 14, creatinine 1.05.  AST 24, ALT 18, total bilirubin 1.1.  Lactic acid 1.3, procalcitonin 0.83.  INR 1.4.  COVID-19 PCR negative.  Chest x-ray with cardiomegaly and pulmonary vascular prominence without overt edema or acute airspace opacity.  CT chest without contrast with left lower lobe bronchitis/pneumonia, atherosclerosis with diffuse aneurysmal disease of the thoracic aorta with ascending thoracic aorta measuring up to 4.1 cm in the arch measuring 4 cm, dilated central pulmonary arteries consistent with underlying pulmonary hypertension with evidence of right heart failure.  Patient was started on azithromycin and ceftriaxone by EDP.  Hospitalist service consulted for further evaluation and management of pneumonia with associated generalized weakness with ambulatory dysfunction.  Assessment & Plan:   Community-acquired pneumonia, suspect gram-negative organism Patient presenting to ED via EMS with complaint of progressive productive cough, generalized malaise and weakness.  CT chest without contrast notable for left lower lobe bronchitis/pneumonia. --Azithromycin  500 mg IV daily x5 days --Cefepime 2 g IV every 12 hours --Mucinex 6 mg p.o. every 12 hours --Albuterol neb as needed  Dysphagia Family reports coughing while eating on occasion.  Seen by speech therapy with mild oropharyngeal dysphagia. --Mechanical soft diet with thin liquids and meds in pure --Aspiration precautions, oral care  Essential hypertension --Lisinopril 40 mg p.o. daily --Hydralazine 5 mg IV q4h PRN SBP >160   Hypothyroidism --Methimazole 5 mg p.o. daily  Paroxysmal atrial fibrillation Not on chronic anticoagulation due to history of subdural hematoma.  Follows with cardiology outpatient.  Not on rate controlling medication.  EKG with A-fib, rate 58. --Appears to be at baseline, discontinue telemetry  Bradycardia, asymptomatic Follow-up cardiology outpatient, recently had an ambulatory monitor long-term and awaiting results.  Not on rate controlling medications.  Asymptomatic.  No need to continue monitoring telemetry at this point, will discontinue.  AAA Noted on imaging, continue semiannual imaging.  Thrombocytopenia Platelet count 78 on admission, repeat 79; stable.  Not on antiplatelet therapy. --Holding starting Lovenox/heparin for DVT prophylaxis; utilizing SCDs  Dementia --Delirium precautions --Get up during the day --Encourage a familiar face to remain present throughout the day --Keep blinds open and lights on during daylight hours --Minimize the use of opioids/benzodiazepines --Trazodone 25 mg p.o. nightly  Weakness/deconditioning/gait disturbance: At baseline able to ambulate independently with the use of a walker.  From ALF.  Likely decline secondary to infectious process as above. --PT/OT evaluation: Pending   DVT prophylaxis: Place and maintain sequential compression device Start: 07/22/21 1652    Code Status: DNR Family Communication:   Disposition Plan:  Level of care: Telemetry Medical Status is: Inpatient Remains inpatient  appropriate because: IV antibiotics,  awaiting PT evaluation anticipate discharge back to ALF with likely home health in 2 days    Consultants:  None  Procedures:  None  Antimicrobials:  Azithromycin 6/9>> Cefepime 6/9>> Ceftriaxone 6/9 - 6/9   Subjective: Patient seen examined bedside, resting comfortably.  Sitting in bedside chair.  Daughter Pam present in room.  Pleasantly confused.  No specific complaints this morning.  Seen by speech therapy and started on mechanical soft diet.  Continues off of oxygen.  Denies chest pain, no shortness of breath, no abdominal pain, no dizziness.  No acute events overnight per nursing staff.  Objective: Vitals:   07/22/21 2020 07/23/21 0500 07/23/21 0751 07/23/21 1012  BP: (!) 143/70 (!) 155/87 (!) 174/92 (!) 155/78  Pulse:  (!) 58 64 67  Resp: (!) 24 20 15    Temp:  98.4 F (36.9 C) 98.2 F (36.8 C)   TempSrc:  Oral Oral   SpO2: 95%  93%   Weight:      Height:        Intake/Output Summary (Last 24 hours) at 07/23/2021 1057 Last data filed at 07/22/2021 1800 Gross per 24 hour  Intake 1014.58 ml  Output --  Net 1014.58 ml   Filed Weights   07/22/21 0912  Weight: 97 kg    Examination:  Physical Exam: GEN: NAD, alert, pleasantly confused, elderly/frail in appearance HEENT: NCAT, PERRL, EOMI, sclera clear, dry mucous membranes, poor dentition PULM: CTAB w/o wheezes/crackles, normal respiratory effort, on room air with SPO2 95% at rest CV: Irregularly irregular rhythm, bradycardic w/o M/G/R GI: abd soft, NTND, NABS, no R/G/M MSK: Trace bilateral lower extreme peripheral edema up to mid shin, moves all extremities independently NEURO: CN II-XII intact, no focal deficits, sensation to light touch intact PSYCH: normal mood/affect Integumentary: dry/intact, no rashes or wounds    Data Reviewed: I have personally reviewed following labs and imaging studies  CBC: Recent Labs  Lab 07/22/21 0915 07/23/21 0208  WBC 8.2 4.4   NEUTROABS 6.9  --   HGB 11.2* 10.6*  HCT 33.4* 32.0*  MCV 94.6 95.2  PLT 78* 79*   Basic Metabolic Panel: Recent Labs  Lab 07/22/21 0915 07/23/21 0208  NA 139 141  K 3.6 3.9  CL 106 109  CO2 25 28  GLUCOSE 99 96  BUN 14 13  CREATININE 1.05 1.05  CALCIUM 9.2 9.1   GFR: Estimated Creatinine Clearance: 54.7 mL/min (by C-G formula based on SCr of 1.05 mg/dL). Liver Function Tests: Recent Labs  Lab 07/22/21 0915  AST 24  ALT 18  ALKPHOS 56  BILITOT 1.1  PROT 6.2*  ALBUMIN 3.2*   No results for input(s): "LIPASE", "AMYLASE" in the last 168 hours. No results for input(s): "AMMONIA" in the last 168 hours. Coagulation Profile: Recent Labs  Lab 07/22/21 0915  INR 1.4*   Cardiac Enzymes: No results for input(s): "CKTOTAL", "CKMB", "CKMBINDEX", "TROPONINI" in the last 168 hours. BNP (last 3 results) No results for input(s): "PROBNP" in the last 8760 hours. HbA1C: No results for input(s): "HGBA1C" in the last 72 hours. CBG: No results for input(s): "GLUCAP" in the last 168 hours. Lipid Profile: No results for input(s): "CHOL", "HDL", "LDLCALC", "TRIG", "CHOLHDL", "LDLDIRECT" in the last 72 hours. Thyroid Function Tests: No results for input(s): "TSH", "T4TOTAL", "FREET4", "T3FREE", "THYROIDAB" in the last 72 hours. Anemia Panel: No results for input(s): "VITAMINB12", "FOLATE", "FERRITIN", "TIBC", "IRON", "RETICCTPCT" in the last 72 hours. Sepsis Labs: Recent Labs  Lab 07/22/21 0915 07/22/21 1821  PROCALCITON  --  0.47  LATICACIDVEN 1.3  --     Recent Results (from the past 240 hour(s))  Blood Culture (routine x 2)     Status: None (Preliminary result)   Collection Time: 07/22/21  9:11 AM   Specimen: BLOOD  Result Value Ref Range Status   Specimen Description BLOOD BLOOD LEFT WRIST  Final   Special Requests   Final    BOTTLES DRAWN AEROBIC AND ANAEROBIC Blood Culture adequate volume   Culture   Final    NO GROWTH 1 DAY Performed at [facility], [place] [address], [place], [place] [address]    Report Status PENDING  Incomplete  SARS Coronavirus 2 by RT PCR (hospital order, performed in [facility] hospital lab) *cepheid single result test* Anterior Nasal Swab     Status: None   Collection Time: 07/22/21  9:20 AM   Specimen: Anterior Nasal Swab  Result Value Ref Range Status   SARS Coronavirus 2 by RT PCR NEGATIVE NEGATIVE Final    Comment: (NOTE) SARS-CoV-2 target nucleic acids are NOT DETECTED.  The SARS-CoV-2 RNA is generally detectable in upper and lower respiratory specimens during the acute phase of infection. The lowest concentration of SARS-CoV-2 viral copies this assay can detect is 250 copies / mL. A negative result does not preclude SARS-CoV-2 infection and should not be used as the sole basis for treatment or other patient management decisions.  A negative result may occur with improper specimen collection / handling, submission of specimen other than nasopharyngeal swab, presence of viral mutation(s) within the areas targeted by this assay, and inadequate number of viral copies (<250 copies / mL). A negative result must be combined with clinical observations, patient history, and epidemiological information.  Fact Sheet for Patients:   https://www.patel.info/  Fact Sheet for Healthcare Providers: https://hall.com/  This test is not yet approved or  cleared by the [place] FDA and has been authorized for detection and/or diagnosis of SARS-CoV-2 by FDA under an Emergency Use Authorization (EUA).  This EUA will remain in effect (meaning this test can be used) for the duration of the COVID-19 declaration under Section 564(b)(1) of the Act, 21 U.S.C. section 360bbb-3(b)(1), unless the authorization is terminated or revoked sooner.  Performed at [facility], [place] [address] [place], [place] [address]   Blood Culture (routine x 2)     Status: None (Preliminary  result)   Collection Time: 07/22/21 10:13 AM   Specimen: BLOOD  Result Value Ref Range Status   Specimen Description BLOOD BLOOD RIGHT HAND  Final   Special Requests   Final    BOTTLES DRAWN AEROBIC AND ANAEROBIC Blood Culture adequate volume   Culture   Final    NO GROWTH < 24 HOURS Performed at [facility], [place] [address] [place], [place] [address]    Report Status PENDING  Incomplete         Radiology Studies: CT Chest W Contrast  Result Date: 07/22/2021 CLINICAL DATA:  Weakness, cough and malaise with suspected pneumonia. EXAM: CT CHEST WITH CONTRAST TECHNIQUE: Multidetector CT imaging of the chest was performed during intravenous contrast administration. RADIATION DOSE REDUCTION: This exam was performed according to the departmental dose-optimization program which includes automated exposure control, adjustment of the mA and/or kV according to patient size and/or use of iterative reconstruction technique. CONTRAST:  95mL OMNIPAQUE IOHEXOL 350 MG/ML SOLN COMPARISON:  Chest x-ray earlier today. FINDINGS: Cardiovascular: Atherosclerosis and aneurysmal disease of the thoracic aorta. The ascending  thoracic aorta measures up to 4.1 cm, the proximal arch measures 4 cm, the distal arch measures 3.8 cm and the descending thoracic aorta measures up to 3.8 cm. The heart is mild-to-moderately enlarged. Coronary atherosclerosis present with calcified plaque in a 3 vessel distribution. No pericardial fluid identified. Central pulmonary arteries are dilated with the main pulmonary artery measuring up to 3.8 cm. Reflux of contrast into the IVC and hepatic veins is consistent with some degree of right heart failure which is also supported by right ventricular dilatation. Mediastinum/Nodes: No enlarged mediastinal, hilar, or axillary lymph nodes. Thyroid gland, trachea, and esophagus demonstrate no significant findings. Lungs/Pleura: There is some respiratory motion. Bronchial thickening and some  surrounding airspace opacity in the left lower lobe is consistent with bronchitis and potentially additional pneumonia. Underlying chronic lung disease suspected. No overt edema, pleural fluid or pneumothorax. Upper Abdomen: No acute abnormality. Musculoskeletal: Osteopenia. No fractures or bony lesions identified. IMPRESSION: 1. Suspect bronchitis and pneumonia in the left lower lobe. 2. Atherosclerosis and diffuse aneurysmal disease of the thoracic aorta. The ascending thoracic aorta measures up to 4.1 cm and the arch measures up to 4 cm. Recommend semi-annual imaging followup by CTA or MRA and referral to cardiothoracic surgery if not already obtained. This recommendation follows 2010 ACCF/AHA/AATS/ACR/ASA/SCA/SCAI/SIR/STS/SVM Guidelines for the Diagnosis and Management of Patients With Thoracic Aortic Disease. Circulation. 2010; [id number]. Aortic aneurysm NOS (ICD10-I71.9) 3. Dilated central pulmonary arteries are consistent with underlying pulmonary hypertension and there also is evidence of right heart failure. 4. Coronary atherosclerosis with calcified plaque in a 3 vessel distribution. Aortic aneurysm NOS (ICD10-I71.9). Electronically Signed   By: Aletta Edouard M.D.   On: 07/22/2021 14:27   DG Chest Port 1 View  Result Date: 07/22/2021 CLINICAL DATA:  Questionable sepsis EXAM: PORTABLE CHEST 1 VIEW COMPARISON:  01/06/2019 FINDINGS: Cardiomegaly. Pulmonary vascular prominence without overt edema or acute airspace opacity. The visualized skeletal structures are unremarkable. IMPRESSION: Cardiomegaly and pulmonary vascular prominence without overt edema or acute airspace opacity. Electronically Signed   By: Delanna Ahmadi M.D.   On: 07/22/2021 09:26        Scheduled Meds:  docusate sodium  100 mg Oral BID   lisinopril  40 mg Oral QHS   loratadine  10 mg Oral Daily   methimazole  5 mg Oral Daily   sodium chloride flush  3 mL Intravenous Q12H   traZODone  25 mg Oral QHS   Continuous  Infusions:  azithromycin Stopped (07/22/21 1314)   ceFEPime (MAXIPIME) IV 2 g (07/23/21 1008)     LOS: 1 day    Time spent: 52 minutes spent on chart review, discussion with nursing staff, consultants, updating family and interview/physical exam; more than 50% of that time was spent in counseling and/or coordination of care.    Jonathan Kirkendoll J [place] DO [facility] Available via Epic secure chat 7am-7pm After these hours, please refer to coverage provider listed on [email or web address] 07/23/2021, 10:57 AM

## 2021-07-23 NOTE — Evaluation (Signed)
Physical Therapy Evaluation Patient Details Name: Roger Briggs MRN: [id number] DOB: 1929-12-02 Today's Date: 07/23/2021  History of Present Illness  Pt is an 88 y/o male admitted after progressive weakness, cough and malaise at his ALF. Chest CT shows PNA. PMH: HTN, dementia, hyperthyroidism, and hx of AAA repair.  Clinical Impression  Patient was fairly independent with mobility with RW at ALF prior to recent decline.  Currently, presents with dependencies in gait and mobility related to generalized weakness and decreased balance.  Patient would benefit from PT to progress mobility back to baseline.   Recommend F/U HHPT at ALF.       Recommendations for follow up therapy are one component of a multi-disciplinary discharge planning process, led by the attending physician.  Recommendations may be updated based on patient status, additional functional criteria and insurance authorization.  Follow Up Recommendations Home health PT (at ALF)    Assistance Recommended at Discharge Frequent or constant Supervision/Assistance  Patient can return home with the following  A little help with walking and/or transfers;A little help with bathing/dressing/bathroom;Assistance with cooking/housework;Help with stairs or ramp for entrance    Equipment Recommendations None recommended by PT  Recommendations for Other Services       Functional Status Assessment Patient has had a recent decline in their functional status and demonstrates the ability to make significant improvements in function in a reasonable and predictable amount of time.     Precautions / Restrictions Precautions Precautions: Fall;Other (comment) Precaution Comments: incontinence      Mobility  Bed Mobility               General bed mobility comments: patient in chair upon PT arrival    Transfers Overall transfer level: Needs assistance Equipment used: Rolling walker (2 wheels) Transfers: Sit to/from Stand Sit to  Stand: Mod assist                Ambulation/Gait Ambulation/Gait assistance: Min assist Gait Distance (Feet): 60 Feet Assistive device: Rolling walker (2 wheels) Gait Pattern/deviations: Step-to pattern, Decreased stride length, Decreased weight shift to right, Decreased weight shift to left, Knee flexed in stance - right, Knee flexed in stance - left, Trunk flexed, Narrow base of support Gait velocity: decreased        Stairs            Wheelchair Mobility    Modified Rankin (Stroke Patients Only)       Balance Overall balance assessment: Needs assistance Sitting-balance support: No upper extremity supported, Feet supported Sitting balance-Leahy Scale: Fair     Standing balance support: Bilateral upper extremity supported, During functional activity, Reliant on assistive device for balance Standing balance-Leahy Scale: Poor                               Pertinent Vitals/Pain Pain Assessment Pain Assessment: No/denies pain    Home Living Family/patient expects to be discharged to:: Assisted living   Available Help at Discharge: Available 24 hours/day;Personal care attendant (ALF staff, family intermittently) Type of Home: Assisted living           Home Equipment: Conservation officer, nature (2 wheels) Additional Comments: from [facility]    Prior Function Prior Level of Function : Needs assist       Physical Assist : ADLs (physical)   ADLs (physical): Dressing;Bathing;Toileting Mobility Comments: Pt typically ambulatory with RW. Last fall in March 2023 resulting in head laceration, ADLs Comments: Per  daughter, pt has assistance with showering, dressing (primarily LB) and toileting (bowel incontinence progressing; wears depends typically)     Hand Dominance   Dominant Hand: Right    Extremity/Trunk Assessment        Lower Extremity Assessment Lower Extremity Assessment: Generalized weakness    Cervical / Trunk  Assessment Cervical / Trunk Assessment: Kyphotic  Communication   Communication: No difficulties  Cognition Arousal/Alertness: Awake/alert Behavior During Therapy: WFL for tasks assessed/performed Overall Cognitive Status: History of cognitive impairments - at baseline                                 General Comments: hx of dementia. Daughter present and reports cognition at baseline - pt unaware of reason for hospitalization, memory deficits noted [place]. very pleasant, willing to participate and follows directions consistently        General Comments      Exercises General Exercises - Lower Extremity Ankle Circles/Pumps: AROM, Both, 10 reps, Seated Gluteal Sets: AROM, Both, 10 reps, Seated Long Arc Quad: AROM, Both, 10 reps, Seated Hip Flexion/Marching: AROM, Both, 10 reps, Seated   Assessment/Plan    PT Assessment Patient needs continued PT services  PT Problem List Decreased strength;Decreased range of motion;Decreased activity tolerance;Decreased balance;Decreased mobility;Decreased cognition       PT Treatment Interventions DME instruction;Gait training;Functional mobility training;Balance training;Therapeutic exercise;Therapeutic activities;Patient/family education    PT Goals (Current goals can be found in the Care Plan section)  Acute Rehab PT Goals Patient Stated Goal: family goal - return to ALF PT Goal Formulation: With patient/family Time For Goal Achievement: 07/30/21 Potential to Achieve Goals: Good    Frequency Min 3X/week     Co-evaluation               AM-PAC PT "6 Clicks" Mobility  Outcome Measure Help needed turning from your back to your side while in a flat bed without using bedrails?: A Little Help needed moving from lying on your back to sitting on the side of a flat bed without using bedrails?: A Little Help needed moving to and from a bed to a chair (including a wheelchair)?: A Lot Help  needed standing up from a chair using your arms (e.g., wheelchair or bedside chair)?: A Lot Help needed to walk in hospital room?: A Little Help needed climbing 3-5 steps with a railing? : A Lot 6 Click Score: 15    End of Session Equipment Utilized During Treatment: Gait belt Activity Tolerance: Patient tolerated treatment well Patient left: in chair;with chair alarm set;with call bell/phone within reach;with family/visitor present   PT Visit Diagnosis: Unsteadiness on feet (R26.81);Other abnormalities of gait and mobility (R26.89);Muscle weakness (generalized) (M62.81)    Time: [id number] PT Time Calculation (min) (ACUTE ONLY): 27 min   Charges:   PT Evaluation $PT Eval Moderate Complexity: 1 Mod PT Treatments $Gait Training: 8-22 mins        07/23/2021 Margie, PT Acute Rehabilitation Services Office:  [phone]   Shanna Cisco 07/23/2021, 12:28 PM

## 2021-07-23 NOTE — Evaluation (Signed)
Occupational Therapy Evaluation Patient Details Name: Roger Briggs MRN: [id number] DOB: 05/12/1929 Today's Date: 07/23/2021   History of Present Illness Pt is an 88 y/o male admitted after progressive weakness, cough and malaise at his ALF. Chest CT shows PNA. PMH: HTN, dementia, hyperthyroidism, and hx of AAA repair.   Clinical Impression   PTA, pt from [facility], typically ambulatory with RW and receives assist for ADLs. Pt's daughter present, supportive and assists in providing PLOF info. Overall, pt appears fairly close to baseline for ADLs and cognition. Pt able to complete bathroom mobility using RW at min guard though appeared fatigued with this activity, which is below baseline for pt. Encouraged OOB activities, use of suction device for phlegm and IS use with assistance to combat PNA. Will follow acutely to progress overall endurance and decrease fall risk during daily tasks. Anticipate no OT needs at DC as ALF staff provides ADL assistance with pt close to baseline.   SpO2 96% on RA at rest. SpO2 90% on RA post-activity.     Recommendations for follow up therapy are one component of a multi-disciplinary discharge planning process, led by the attending physician.  Recommendations may be updated based on patient status, additional functional criteria and insurance authorization.   Follow Up Recommendations  No OT follow up    Assistance Recommended at Discharge Intermittent Supervision/Assistance  Patient can return home with the following A little help with walking and/or transfers;A lot of help with bathing/dressing/bathroom    Functional Status Assessment  Patient has had a recent decline in their functional status and demonstrates the ability to make significant improvements in function in a reasonable and predictable amount of time.  Equipment Recommendations  None recommended by OT    Recommendations for Other Services       Precautions / Restrictions  Precautions Precautions: Fall;Other (comment) Precaution Comments: incontinence Restrictions Weight Bearing Restrictions: No      Mobility Bed Mobility Overal bed mobility: Needs Assistance Bed Mobility: Supine to Sit     Supine to sit: Min assist, HOB elevated     General bed mobility comments: handheld assist to lift trunk to EOB    Transfers Overall transfer level: Needs assistance Equipment used: Rolling walker (2 wheels) Transfers: Sit to/from Stand Sit to Stand: Mod assist           General transfer comment: Per daughter, pt's bed height does not adjust at ALF and fairly low - attempted stand from this height and similar height at toilet with overall Mod A needed with increased time to rise and assist for balance while pt reaching to RW from bedside, grab bars in bathroom      Balance Overall balance assessment: Needs assistance Sitting-balance support: No upper extremity supported, Feet supported Sitting balance-Leahy Scale: Fair     Standing balance support: Bilateral upper extremity supported, During functional activity, Reliant on assistive device for balance Standing balance-Leahy Scale: Poor                             ADL either performed or assessed with clinical judgement   ADL Overall ADL's : Needs assistance/impaired Eating/Feeding: Set up Eating/Feeding Details (indicate cue type and reason): though NPO at this time, awaiting SLP eval Grooming: Min guard;Standing   Upper Body Bathing: Minimal assistance   Lower Body Bathing: Moderate assistance;Sit to/from stand   Upper Body Dressing : Minimal assistance;Sitting   Lower Body Dressing: Maximal assistance;Sit to/from stand  Lower Body Dressing Details (indicate cue type and reason): assist to manage socks,pt unable to reach B feet. Pt would likely need assist in standing to maintain balance to pull pants over waist as well Toilet Transfer: Min guard;Ambulation;Rolling walker (2  wheels);Regular Toilet;Grab bars Toilet Transfer Details (indicate cue type and reason): cues to stay inside RW for bathroom mobility, cues to use grab bar to slowly lower self on toilet Toileting- Clothing Manipulation and Hygiene: Total assistance;Sit to/from stand Toileting - Clothing Manipulation Details (indicate cue type and reason): pt experienced bowel incontinence during bathroom mobility, Total A to cleanup to maintain contaminated areas. Pt able to stand with RW while assisted with peri care     Functional mobility during ADLs: Min guard;Rolling walker (2 wheels);Cueing for sequencing General ADL Comments: Pt eager to participate, fairly close to ADL baseline though appeared more fatigued with bathroom mobility and benefits from cues for DME sequencing     Vision Baseline Vision/History: 1 Wears glasses Ability to See in Adequate Light: 0 Adequate Patient Visual Report: No change from baseline Vision Assessment?: No apparent visual deficits     Perception     Praxis      Pertinent Vitals/Pain Pain Assessment Pain Assessment: No/denies pain     Hand Dominance Right   Extremity/Trunk Assessment Upper Extremity Assessment Upper Extremity Assessment: Generalized weakness   Lower Extremity Assessment Lower Extremity Assessment: Defer to PT evaluation   Cervical / Trunk Assessment Cervical / Trunk Assessment: Kyphotic   Communication Communication Communication: No difficulties   Cognition Arousal/Alertness: Awake/alert Behavior During Therapy: WFL for tasks assessed/performed Overall Cognitive Status: History of cognitive impairments - at baseline                                 General Comments: hx of dementia. Daughter present and reports cognition at baseline - pt unaware of reason for hospitalization, memory deficits noted with pt repeating sentences at times. very pleasant, willing to participate and follows directions consistently     General  Comments  SpO2 96% on RA at rest, 90% after bathroom tasks on RA. Pt denies SOB. HR stable, unable to obtain BP reading post activity with monitor (RN present, brought in dynamap to assess)    Exercises     Shoulder Instructions      Home Living Family/patient expects to be discharged to:: Assisted living                                 Additional Comments: from [facility]      Prior Functioning/Environment Prior Level of Function : Needs assist       Physical Assist : ADLs (physical)   ADLs (physical): Dressing;Bathing;Toileting Mobility Comments: Pt typically ambulatory with RW. Last fall in March 2023 resulting in head laceration, ADLs Comments: Per daughter, pt has assistance with showering, dressing (primarily LB) and toileting (bowel incontinence progressing; wears depends typically)        OT Problem List: Decreased strength;Impaired balance (sitting and/or standing);Decreased activity tolerance;Cardiopulmonary status limiting activity;Decreased cognition;Decreased safety awareness      OT Treatment/Interventions: Self-care/ADL training;Therapeutic exercise;Energy conservation;DME and/or AE instruction;Therapeutic activities;Patient/family education;Balance training    OT Goals(Current goals can be found in the care plan section) Acute Rehab OT Goals Patient Stated Goal: would like to have swallow study done so pt can hopefully be cleared to eat OT  Goal Formulation: With patient/family Time For Goal Achievement: 08/06/21 Potential to Achieve Goals: Fair ADL Goals Pt Will Transfer to Toilet: with set-up;ambulating Additional ADL Goal #1: Pt to increase standing activity tolerance > 7 min during ADLs/mobility with stable vitals to improve overall endurance  OT Frequency: Min 2X/week    Co-evaluation              AM-PAC OT "6 Clicks" Daily Activity     Outcome Measure Help from another person eating meals?: A Little Help from another  person taking care of personal grooming?: A Little Help from another person toileting, which includes using toliet, bedpan, or urinal?: Total Help from another person bathing (including washing, rinsing, drying)?: A Lot Help from another person to put on and taking off regular upper body clothing?: A Little Help from another person to put on and taking off regular lower body clothing?: A Lot 6 Click Score: 14   End of Session Equipment Utilized During Treatment: Gait belt;Rolling walker (2 wheels) Nurse Communication: Mobility status  Activity Tolerance: Patient tolerated treatment well Patient left: in chair;with call bell/phone within reach;with chair alarm set;with family/visitor present  OT Visit Diagnosis: Muscle weakness (generalized) (M62.81)                Time: 6226-3335 OT Time Calculation (min): 37 min Charges:  OT General Charges $OT Visit: 1 Visit OT Evaluation $OT Eval Moderate Complexity: 1 Mod OT Treatments $Self Care/Home Management : 8-22 mins  Bradd Canary, OTR/L Acute Rehab Services Office: [phone]   Lorre Munroe 07/23/2021, 8:20 AM

## 2021-07-24 DIAGNOSIS — F0390 Unspecified dementia without behavioral disturbance: Secondary | ICD-10-CM | POA: Diagnosis not present

## 2021-07-24 DIAGNOSIS — I10 Essential (primary) hypertension: Secondary | ICD-10-CM | POA: Diagnosis not present

## 2021-07-24 DIAGNOSIS — Z66 Do not resuscitate: Secondary | ICD-10-CM | POA: Diagnosis not present

## 2021-07-24 DIAGNOSIS — J189 Pneumonia, unspecified organism: Secondary | ICD-10-CM | POA: Diagnosis not present

## 2021-07-24 NOTE — Progress Notes (Signed)
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  Onset a few days prior associated with decreased ability to ambulate.  No known sick contacts.  At baseline able to ambulate with a walker without difficulty.  Remote history of tobacco abuse but no known diagnosis of COPD.  Denied fever.  In the ED, temperature 98.6 F, HR 61, RR 22, BP 139/75, SPO2 94% on room air.  WBC 8.2, hemoglobin 11.2, platelets 78.  Sodium 139, potassium 3.6, chloride 106, CO2 25, glucose 99, BUN 14, creatinine 1.05.  AST 24, ALT 18, total bilirubin 1.1.  Lactic acid 1.3, procalcitonin 0.83.  INR 1.4.  COVID-19 PCR negative.  Chest x-ray with cardiomegaly and pulmonary vascular prominence without overt edema or acute airspace opacity.  CT chest without contrast with left lower lobe bronchitis/pneumonia, atherosclerosis with diffuse aneurysmal disease of the thoracic aorta with ascending thoracic aorta measuring up to 4.1 cm in the arch measuring 4 cm, dilated central pulmonary arteries consistent with underlying pulmonary hypertension with evidence of right heart failure.  Patient was started on azithromycin and ceftriaxone by EDP.  Hospitalist service consulted for further evaluation and management of pneumonia with associated generalized weakness with ambulatory dysfunction.  Assessment & Plan:   Community-acquired pneumonia, suspect gram-negative organism Patient presenting to ED via EMS with complaint of progressive productive cough, generalized malaise and weakness.  CT chest without contrast notable for left lower lobe bronchitis/pneumonia. --Azithromycin  500 mg IV daily x5 days --Cefepime 2 g IV every 12 hours --Mucinex 6 mg p.o. every 12 hours --Albuterol neb as needed  Dysphagia Family reports coughing while eating on occasion.  Seen by speech therapy with mild oropharyngeal dysphagia. --Mechanical soft diet with thin liquids and meds in pure --Aspiration precautions, oral care  Essential hypertension --Lisinopril 40 mg p.o. daily --Hydralazine 5 mg IV q4h PRN SBP >160   Hypothyroidism --Methimazole 5 mg p.o. daily  Paroxysmal atrial fibrillation Not on chronic anticoagulation due to history of subdural hematoma.  Follows with cardiology outpatient.  Not on rate controlling medication.  EKG with A-fib, rate 58. --Appears to be at baseline, discontinue telemetry  Bradycardia, asymptomatic Follow-up cardiology outpatient, recently had an ambulatory monitor long-term and awaiting results.  Not on rate controlling medications.  Asymptomatic.  No need to continue monitoring telemetry at this point, will discontinue.  AAA Noted on imaging, continue semiannual imaging.  Thrombocytopenia Platelet count 78 on admission, repeat 79; stable.  Not on antiplatelet therapy. --Holding starting Lovenox/heparin for DVT prophylaxis; utilizing SCDs  Dementia --Delirium precautions --Get up during the day --Encourage a familiar face to remain present throughout the day --Keep blinds open and lights on during daylight hours --Minimize the use of opioids/benzodiazepines --Trazodone 25 mg p.o. nightly  Weakness/deconditioning/gait disturbance: At baseline able to ambulate independently with the use of a walker.  From ALF.  Likely decline secondary to infectious process as above. --PT/OT evaluation: Recommend home health on discharge, home health orders placed p  DVT prophylaxis: Place and maintain sequential compression device Start: 07/22/21 1652    Code Status: DNR Family Communication:   Disposition Plan:  Level of care:  Med-Surg Status is:  Inpatient Remains inpatient appropriate because: IV antibiotics, anticipate discharge home with home health tomorrow.    Consultants:  None  Procedures:  None  Antimicrobials:  Azithromycin 6/9>> Cefepime 6/9>> Ceftriaxone 6/9 - 6/9   Subjective: Patient seen examined bedside, resting comfortably.  In bed.  Daughter Pam present. Pleasantly confused.  No specific complaints this morning.  Seen by PT/OT yesterday with recommendation of home health.  Remains off of oxygen.  Discussed with patient's daughter will continue IV antibiotics today with plan discharge back to ALF tomorrow with home health.  Denies chest pain, no shortness of breath, no abdominal pain, no dizziness.  No acute events overnight per nursing staff.  Objective: Vitals:   07/23/21 0751 07/23/21 1012 07/23/21 1643 07/23/21 1935  BP: (!) 174/92 (!) 155/78 (!) 171/92 (!) 154/92  Pulse: 64 67 63 61  Resp: 15  17 18   Temp: 98.2 F (36.8 C)  98 F (36.7 C) 98.2 F (36.8 C)  TempSrc: Oral  Oral Oral  SpO2: 93%  94% 95%  Weight:      Height:        Intake/Output Summary (Last 24 hours) at 07/24/2021 1151 Last data filed at 07/24/2021 0500 Gross per 24 hour  Intake --  Output 652 ml  Net -652 ml   Filed Weights   07/22/21 0912  Weight: 97 kg    Examination:  Physical Exam: GEN: NAD, alert, pleasantly confused, elderly/frail in appearance HEENT: NCAT, PERRL, EOMI, sclera clear, dry mucous membranes, poor dentition PULM: CTAB w/o wheezes/crackles, normal respiratory effort, on room air with SPO2 95% at rest CV: Irregularly irregular rhythm, bradycardic w/o M/G/R GI: abd soft, NTND, NABS, no R/G/M MSK: Trace bilateral lower extreme peripheral edema up to mid shin, moves all extremities independently NEURO: CN II-XII intact, no focal deficits, sensation to light touch intact PSYCH: normal mood/affect Integumentary: dry/intact, no rashes or wounds    Data Reviewed: I have personally  reviewed following labs and imaging studies  CBC: Recent Labs  Lab 07/22/21 0915 07/23/21 0208  WBC 8.2 4.4  NEUTROABS 6.9  --   HGB 11.2* 10.6*  HCT 33.4* 32.0*  MCV 94.6 95.2  PLT 78* 79*   Basic Metabolic Panel: Recent Labs  Lab 07/22/21 0915 07/23/21 0208  NA 139 141  K 3.6 3.9  CL 106 109  CO2 25 28  GLUCOSE 99 96  BUN 14 13  CREATININE 1.05 1.05  CALCIUM 9.2 9.1   GFR: Estimated Creatinine Clearance: 54.7 mL/min (by C-G formula based on SCr of 1.05 mg/dL). Liver Function Tests: Recent Labs  Lab 07/22/21 0915  AST 24  ALT 18  ALKPHOS 56  BILITOT 1.1  PROT 6.2*  ALBUMIN 3.2*   No results for input(s): "LIPASE", "AMYLASE" in the last 168 hours. No results for input(s): "AMMONIA" in the last 168 hours. Coagulation Profile: Recent Labs  Lab 07/22/21 0915  INR 1.4*   Cardiac Enzymes: No results for input(s): "CKTOTAL", "CKMB", "CKMBINDEX", "TROPONINI" in the last 168 hours. BNP (last 3 results) No results for input(s): "PROBNP" in the last 8760 hours. HbA1C: No results for input(s): "HGBA1C" in the last 72 hours. CBG: No results for input(s): "GLUCAP" in the last 168 hours. Lipid Profile: No results for input(s): "CHOL", "HDL", "LDLCALC", "TRIG", "CHOLHDL", "LDLDIRECT" in the last 72 hours. Thyroid Function Tests: No results for input(s): "TSH", "T4TOTAL", "FREET4", "T3FREE", "THYROIDAB" in the last 72 hours. Anemia Panel: No results for input(s): "VITAMINB12", "FOLATE", "FERRITIN", "TIBC", "IRON", "RETICCTPCT" in the last  72 hours. Sepsis Labs: Recent Labs  Lab 07/22/21 0915 07/22/21 1821  PROCALCITON  --  0.83  LATICACIDVEN 1.3  --     Recent Results (from the past 240 hour(s))  Blood Culture (routine x 2)     Status: None (Preliminary result)   Collection Time: 07/22/21  9:11 AM   Specimen: BLOOD  Result Value Ref Range Status   Specimen Description BLOOD BLOOD LEFT WRIST  Final   Special Requests   Final    BOTTLES DRAWN AEROBIC AND  ANAEROBIC Blood Culture adequate volume   Culture   Final    NO GROWTH 1 DAY Performed at [facility], [place] [address] [place], [place] [address]    Report Status PENDING  Incomplete  SARS Coronavirus 2 by RT PCR (hospital order, performed in [facility] hospital lab) *cepheid single result test* Anterior Nasal Swab     Status: None   Collection Time: 07/22/21  9:20 AM   Specimen: Anterior Nasal Swab  Result Value Ref Range Status   SARS Coronavirus 2 by RT PCR NEGATIVE NEGATIVE Final    Comment: (NOTE) SARS-CoV-2 target nucleic acids are NOT DETECTED.  The SARS-CoV-2 RNA is generally detectable in upper and lower respiratory specimens during the acute phase of infection. The lowest concentration of SARS-CoV-2 viral copies this assay can detect is 250 copies / mL. A negative result does not preclude SARS-CoV-2 infection and should not be used as the sole basis for treatment or other patient management decisions.  A negative result may occur with improper specimen collection / handling, submission of specimen other than nasopharyngeal swab, presence of viral mutation(s) within the areas targeted by this assay, and inadequate number of viral copies (<250 copies / mL). A negative result must be combined with clinical observations, patient history, and epidemiological information.  Fact Sheet for Patients:   https://www.patel.info/  Fact Sheet for Healthcare Providers: https://hall.com/  This test is not yet approved or  cleared by the [place] FDA and has been authorized for detection and/or diagnosis of SARS-CoV-2 by FDA under an Emergency Use Authorization (EUA).  This EUA will remain in effect (meaning this test can be used) for the duration of the COVID-19 declaration under Section 564(b)(1) of the Act, 21 U.S.C. section 360bbb-3(b)(1), unless the authorization is terminated or revoked sooner.  Performed at [facility], [place] [address], [place], [place] [address]   Blood Culture (routine x 2)     Status: None (Preliminary result)   Collection Time: 07/22/21 10:13 AM   Specimen: BLOOD  Result Value Ref Range Status   Specimen Description BLOOD BLOOD RIGHT HAND  Final   Special Requests   Final    BOTTLES DRAWN AEROBIC AND ANAEROBIC Blood Culture adequate volume   Culture   Final    NO GROWTH < 24 HOURS Performed at [facility], [place] [address] [place], [place] [address]    Report Status PENDING  Incomplete         Radiology Studies: CT Chest W Contrast  Result Date: 07/22/2021 CLINICAL DATA:  Weakness, cough and malaise with suspected pneumonia. EXAM: CT CHEST WITH CONTRAST TECHNIQUE: Multidetector CT imaging of the chest was performed during intravenous contrast administration. RADIATION DOSE REDUCTION: This exam was performed according to the departmental dose-optimization program which includes automated exposure control, adjustment of the mA and/or kV according to patient size and/or use of iterative reconstruction technique. CONTRAST:  43mL OMNIPAQUE IOHEXOL 350 MG/ML SOLN COMPARISON:  Chest x-ray earlier today. FINDINGS: Cardiovascular: Atherosclerosis and aneurysmal disease of the thoracic aorta. The ascending thoracic aorta measures up to 4.1 cm, the proximal arch measures 4 cm, the distal arch measures 3.8 cm and the descending thoracic aorta measures up to 3.8 cm. The heart is mild-to-moderately enlarged. Coronary atherosclerosis present with calcified plaque in a 3 vessel distribution. No pericardial fluid identified. Central pulmonary arteries are dilated with the main pulmonary artery measuring up to 3.8 cm. Reflux of contrast into the IVC and hepatic veins is consistent with some degree of right heart failure which is also supported by right ventricular dilatation. Mediastinum/Nodes: No enlarged mediastinal, hilar, or axillary lymph nodes. Thyroid gland, trachea, and esophagus  demonstrate no significant findings. Lungs/Pleura: There is some respiratory motion. Bronchial thickening and some surrounding airspace opacity in the left lower lobe is consistent with bronchitis and potentially additional pneumonia. Underlying chronic lung disease suspected. No overt edema, pleural fluid or pneumothorax. Upper Abdomen: No acute abnormality. Musculoskeletal: Osteopenia. No fractures or bony lesions identified. IMPRESSION: 1. Suspect bronchitis and pneumonia in the left lower lobe. 2. Atherosclerosis and diffuse aneurysmal disease of the thoracic aorta. The ascending thoracic aorta measures up to 4.1 cm and the arch measures up to 4 cm. Recommend semi-annual imaging followup by CTA or MRA and referral to cardiothoracic surgery if not already obtained. This recommendation follows 2010 ACCF/AHA/AATS/ACR/ASA/SCA/SCAI/SIR/STS/SVM Guidelines for the Diagnosis and Management of Patients With Thoracic Aortic Disease. Circulation. 2010; [id number]:6099015. Aortic aneurysm NOS (ICD10-I71.9) 3. Dilated central pulmonary arteries are consistent with underlying pulmonary hypertension and there also is evidence of right heart failure. 4. Coronary atherosclerosis with calcified plaque in a 3 vessel distribution. Aortic aneurysm NOS (ICD10-I71.9). Electronically Signed   By: Aletta Edouard M.D.   On: 07/22/2021 14:27        Scheduled Meds:  docusate sodium  100 mg Oral BID   lisinopril  40 mg Oral QHS   loratadine  10 mg Oral Daily   methimazole  5 mg Oral Daily   sodium chloride flush  3 mL Intravenous Q12H   traZODone  25 mg Oral QHS   Continuous Infusions:  azithromycin 500 mg (07/23/21 1131)   ceFEPime (MAXIPIME) IV 2 g (07/24/21 1037)     LOS: 2 days    Time spent: 49 minutes spent on chart review, discussion with nursing staff, consultants, updating family and interview/physical exam; more than 50% of that time was spent in counseling and/or coordination of care.    Kamala Kolton J [place]  DO [facility] Available via Epic secure chat 7am-7pm After these hours, please refer to coverage provider listed on [email or web address] 07/24/2021, 11:51 AM

## 2021-07-24 NOTE — Progress Notes (Signed)
  Mobility Specialist Criteria Algorithm Info.   07/24/21 1719  Mobility  Activity Ambulated with assistance in hallway;Transferred from bed to chair (to chair after ambulation)  Range of Motion/Exercises Active;All extremities  Level of Assistance Contact guard assist, steadying assist  Assistive Device Front wheel walker  Distance Ambulated (ft) 80 ft  Activity Response Tolerated well   Patient received in supine agreeable to participate in mobility. Was independent to EOB from supine > sit. Stood with minimal HHA + cues for hand placement and ambulated in hallway min guard with slow gait. Required mod cues to stay proximal to RW and has a tendency to veer left without noticing. Returned to room without complaint or incident. Was left in recliner chair with all needs met, call bell in reach.   07/24/2021 5:19 PM  [place] Weslee [place], [place], [place]  ZOXWR:[phone] Office: [phone]

## 2021-07-25 MED ORDER — HYDRALAZINE HCL 20 MG/ML IJ SOLN
10.0000 mg | INTRAMUSCULAR | Status: DC | PRN
  Administered 2021-07-25: 10 mg via INTRAVENOUS
  Filled 2021-07-25: qty 1

## 2021-07-25 MED ORDER — AZITHROMYCIN 500 MG PO TABS: 500.0000 mg | ORAL_TABLET | Freq: Every day | ORAL | 0 refills | Status: AC

## 2021-07-25 MED ORDER — CEFDINIR 300 MG PO CAPS: 300.0000 mg | ORAL_CAPSULE | Freq: Two times a day (BID) | ORAL | 0 refills | Status: AC

## 2021-07-25 NOTE — Progress Notes (Addendum)
[name] to be D/C'd to [facility] per MD order. Report given to [place], Charity fundraiser at [employer]. Skin clean and dry with generalized bruising. IV catheter discontinued intact. Site without signs and symptoms of complications. Dressing and pressure applied.  An After Visit Summary was printed and given to the patient's daughter Roger Briggs and Roger Briggs  Patient escorted via stretcher, and D/C via [facility].  Jon Gills  07/25/2021

## 2021-07-25 NOTE — TOC Progression Note (Signed)
Transition of Care [facility]) - Progression Note    Patient Details  Name: Roger Briggs MRN: [id number] Date of Birth: 08/15/1929  [facility]) CM/SW Contact  Bess Kinds, RN Phone Number: [phone] 07/25/2021, 9:48 AM  Clinical Narrative:     [facility] is [facility] provider for [employer]. Referral sent to Trudi Ida liaison - accepted.     Barriers to Discharge: No Barriers Identified  Expected Discharge Plan and Services           Expected Discharge Date: 07/25/21                         HH Arranged: PT HH Agency: [facility] Date HH Agency Contacted: 07/25/21 Time HH Agency Contacted: [phone] Representative spoke with at [facility] Agency: Clifton Custard   Social Determinants of Health (SDOH) Interventions    Readmission Risk Interventions     No data to display

## 2021-07-25 NOTE — Discharge Summary (Signed)
Physician Discharge Summary  Roger Briggs [id number] DOB: 21-Feb-1929 DOA: 07/22/2021  PCP: Pcp, No  Admit date: 07/22/2021 Discharge date: 07/25/2021  Admitted From: [facility] Disposition:   [facility]  Recommendations for Outpatient Follow-up:  Follow up with PCP in 1-2 weeks Continue antibiotics with azithromycin/cefdinir to complete course for community-acquired pneumonia  Home Health: PT Equipment/Devices: None has walker at ALF  Discharge Condition: Stable CODE STATUS: DNR Diet recommendation: Heart healthy diet, mechanical soft with medications in pure, aspiration precautions  History of present illness:  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  Onset a few days prior associated with decreased ability to ambulate.  No known sick contacts.  At baseline able to ambulate with a walker without difficulty.  Remote history of tobacco abuse but no known diagnosis of COPD.  Denied fever.   In the ED, temperature 98.6 F, HR 61, RR 22, BP 139/75, SPO2 94% on room air.  WBC 8.2, hemoglobin 11.2, platelets 78.  Sodium 139, potassium 3.6, chloride 106, CO2 25, glucose 99, BUN 14, creatinine 1.05.  AST 24, ALT 18, total bilirubin 1.1.  Lactic acid 1.3, procalcitonin 0.83.  INR 1.4.  COVID-19 PCR negative.  Chest x-ray with cardiomegaly and pulmonary vascular prominence without overt edema or acute airspace opacity.  CT chest without contrast with left lower lobe bronchitis/pneumonia, atherosclerosis with diffuse aneurysmal disease of the thoracic aorta with ascending thoracic aorta measuring up to 4.1 cm in the arch measuring 4 cm, dilated central pulmonary arteries consistent with underlying pulmonary hypertension  with evidence of right heart failure.  Patient was started on azithromycin and ceftriaxone by EDP.  Hospitalist service consulted for further evaluation and management of pneumonia with associated generalized weakness with ambulatory dysfunction.  Hospital course:  Community-acquired pneumonia, suspect gram-negative organism Patient presenting to ED via EMS with complaint of progressive productive cough, generalized malaise and weakness.  CT chest without contrast notable for left lower lobe bronchitis/pneumonia.  Patient was started on IV antibiotics with azithromycin and cefepime with improvement of symptoms.  Patient will discharge on azithromycin and cefdinir to complete antibiotic course.   Dysphagia Family reports coughing while eating on occasion.  Seen by speech therapy with mild oropharyngeal dysphagia.  SLP recommends mechanical soft diet with thin liquids and meds in pure.  Continue aspiration precautions, oral care.   Essential hypertension Continue lisinopril 40 mg p.o. daily   Hypothyroidism Methimazole 5 mg p.o. daily   Paroxysmal atrial fibrillation Not on chronic anticoagulation due to history of subdural hematoma.  Follows with cardiology outpatient.  Not on rate controlling medication.  EKG with A-fib, rate 58.   Bradycardia, asymptomatic Follow-up cardiology outpatient, recently had an ambulatory monitor long-term and awaiting results.  Not on rate controlling medications.  Asymptomatic.  No need to continue monitoring telemetry at this point, will discontinue.   AAA Noted on imaging, continue semiannual imaging.   Thrombocytopenia Platelet count 78 on admission, repeat 79; stable.  Not on antiplatelet therapy.   Dementia --Delirium precautions --Get up during the day --Encourage a familiar face to remain present throughout the day --Keep blinds open and lights on during daylight hours --Minimize the use of opioids/benzodiazepines --Trazodone 25 mg p.o.  nightly   Weakness/deconditioning/gait disturbance: At baseline able to ambulate  independently with the use of a walker.  From ALF.  Likely decline secondary to infectious process as above. PT/OT recommend home health on discharge.    Discharge Diagnoses:  Principal Problem:   CAP (community acquired pneumonia) Active Problems:   Hyperthyroidism   Sinus bradycardia   Dementia without behavioral disturbance ([place])   Essential hypertension   Thoracic aortic aneurysm ([place])   DNR (do not resuscitate)   Thrombocytopenia ([place])    Discharge Instructions  Discharge Instructions     Call MD for:  difficulty breathing, headache or visual disturbances   Complete by: As directed    Call MD for:  extreme fatigue   Complete by: As directed    Call MD for:  persistant dizziness or light-headedness   Complete by: As directed    Call MD for:  persistant nausea and vomiting   Complete by: As directed    Call MD for:  severe uncontrolled pain   Complete by: As directed    Call MD for:  temperature >100.4   Complete by: As directed    Diet - low sodium heart healthy   Complete by: As directed    Increase activity slowly   Complete by: As directed       Allergies as of 07/25/2021   No Known Allergies      Medication List     TAKE these medications    acetaminophen 500 MG tablet Commonly known as: TYLENOL Take 500 mg by mouth 3 (three) times daily as needed (pain, fever > 100.5).   azithromycin 500 MG tablet Commonly known as: Zithromax Take 1 tablet (500 mg total) by mouth daily for 2 days. Take 1 tablet daily for 3 days.   betamethasone valerate lotion 0.1 % Commonly known as: VALISONE Apply 1 application  topically 2 (two) times daily as needed (itching, eczema).   cefdinir 300 MG capsule Commonly known as: OMNICEF Take 1 capsule (300 mg total) by mouth 2 (two) times daily for 4 days.   CeraVe Daily Moisturizing Lotn Apply 1 Application topically daily.   FeroSul 325  (65 FE) MG tablet Generic drug: ferrous sulfate Take 325 mg by mouth every Monday, Wednesday, and Friday.   furosemide 20 MG tablet Commonly known as: LASIX Take 1 tablet (20 mg total) by mouth daily.   lisinopril 40 MG tablet Commonly known as: ZESTRIL Take 40 mg by mouth at bedtime.   loratadine 10 MG tablet Commonly known as: CLARITIN Take 10 mg by mouth daily.   methimazole 5 MG tablet Commonly known as: TAPAZOLE Take 5 mg by mouth daily.   OVER THE COUNTER MEDICATION Take 20 mLs by mouth every 4 (four) hours as needed (Congestion). Robitussin Maximum Strength   traZODone 50 MG tablet Commonly known as: DESYREL Take 25 mg by mouth at bedtime.   vitamin B-12 1000 MCG tablet Commonly known as: CYANOCOBALAMIN Take 1,000 mcg by mouth daily.   Vitamin D3 50 MCG (2000 UT) capsule Take 2,000 Units by mouth daily.        Follow-up Information     O'Neal, Cassie Freer, MD. Schedule an appointment as soon as possible for a visit in 1 week(s).   Specialties: Cardiology, Internal Medicine, Radiology Contact information: [place] [address] [phone]                No Known Allergies  Consultations: None   Procedures/Studies: CT Chest W Contrast  Result Date: 07/22/2021 CLINICAL DATA:  Weakness, cough and malaise  with suspected pneumonia. EXAM: CT CHEST WITH CONTRAST TECHNIQUE: Multidetector CT imaging of the chest was performed during intravenous contrast administration. RADIATION DOSE REDUCTION: This exam was performed according to the departmental dose-optimization program which includes automated exposure control, adjustment of the mA and/or kV according to patient size and/or use of iterative reconstruction technique. CONTRAST:  15mL OMNIPAQUE IOHEXOL 350 MG/ML SOLN COMPARISON:  Chest x-ray earlier today. FINDINGS: Cardiovascular: Atherosclerosis and aneurysmal disease of the thoracic aorta. The ascending thoracic aorta measures up to  4.1 cm, the proximal arch measures 4 cm, the distal arch measures 3.8 cm and the descending thoracic aorta measures up to 3.8 cm. The heart is mild-to-moderately enlarged. Coronary atherosclerosis present with calcified plaque in a 3 vessel distribution. No pericardial fluid identified. Central pulmonary arteries are dilated with the main pulmonary artery measuring up to 3.8 cm. Reflux of contrast into the IVC and hepatic veins is consistent with some degree of right heart failure which is also supported by right ventricular dilatation. Mediastinum/Nodes: No enlarged mediastinal, hilar, or axillary lymph nodes. Thyroid gland, trachea, and esophagus demonstrate no significant findings. Lungs/Pleura: There is some respiratory motion. Bronchial thickening and some surrounding airspace opacity in the left lower lobe is consistent with bronchitis and potentially additional pneumonia. Underlying chronic lung disease suspected. No overt edema, pleural fluid or pneumothorax. Upper Abdomen: No acute abnormality. Musculoskeletal: Osteopenia. No fractures or bony lesions identified. IMPRESSION: 1. Suspect bronchitis and pneumonia in the left lower lobe. 2. Atherosclerosis and diffuse aneurysmal disease of the thoracic aorta. The ascending thoracic aorta measures up to 4.1 cm and the arch measures up to 4 cm. Recommend semi-annual imaging followup by CTA or MRA and referral to cardiothoracic surgery if not already obtained. This recommendation follows 2010 ACCF/AHA/AATS/ACR/ASA/SCA/SCAI/SIR/STS/SVM Guidelines for the Diagnosis and Management of Patients With Thoracic Aortic Disease. Circulation. 2010; [id number]. Aortic aneurysm NOS (ICD10-I71.9) 3. Dilated central pulmonary arteries are consistent with underlying pulmonary hypertension and there also is evidence of right heart failure. 4. Coronary atherosclerosis with calcified plaque in a 3 vessel distribution. Aortic aneurysm NOS (ICD10-I71.9). Electronically Signed   By:  Aletta Edouard M.D.   On: 07/22/2021 14:27   DG Chest Port 1 View  Result Date: 07/22/2021 CLINICAL DATA:  Questionable sepsis EXAM: PORTABLE CHEST 1 VIEW COMPARISON:  01/06/2019 FINDINGS: Cardiomegaly. Pulmonary vascular prominence without overt edema or acute airspace opacity. The visualized skeletal structures are unremarkable. IMPRESSION: Cardiomegaly and pulmonary vascular prominence without overt edema or acute airspace opacity. Electronically Signed   By: Delanna Ahmadi M.D.   On: 07/22/2021 09:26   LONG TERM MONITOR (3-14 DAYS)  Result Date: 07/11/2021 Enrollment 06/21/2021-06/25/2021 (4 days 0 hours). Atrial Fibrillation occurred continuously (100% burden), ranging from 32-101 bpm (avg of 49 bpm). 1 Pause occurred lasting 3 secs (20 bpm). This occurred at 3:58 AM (nocturnal pause). Isolated VEs were rare (<1.0%), VE Couplets were rare (<1.0%), and no VE Triplets were present. Ventricular Bigeminy and Trigeminy were present. Impression: 1. 100% Afib burden. Average HR 49 bpm. 2. One nocturnal pause occurred ~3 seconds. 3. Rare ectopy. [place] T. Audie Box, MD, [place] [address] [place], [place] [address] [phone] 1:32 PM     Subjective: Patient seen examined bedside, resting comfortably.  Family present at bedside.  No complaints this morning.  Pleasantly confused.  Ready for discharge back to ALF.  Denies headache, no fever/chills/night sweats, no nausea/vomiting/diarrhea, no chest pain, no shortness of breath, no abdominal pain.  No  acute events overnight per nursing staff.  Discharge Exam: Vitals:   07/25/21 0553 07/25/21 0835  BP: 124/66 (!) 168/96  Pulse: 78 72  Resp:  18  Temp: 98.2 F (36.8 C) 98.6 F (37 C)  SpO2: 100% 94%   Vitals:   07/25/21 0127 07/25/21 0411 07/25/21 0553 07/25/21 0835  BP: (!) 154/85 (!) 140/96 124/66 (!) 168/96  Pulse:  79 78 72  Resp:  16  18  Temp:  97.8 F (36.6 C) 98.2 F (36.8 C) 98.6 F (37 C)   TempSrc:  Oral Oral   SpO2:  94% 100% 94%  Weight:      Height:        Physical Exam: GEN: NAD, alert, pleasantly confused, elderly/frail in appearance HEENT: NCAT, PERRL, EOMI, sclera clear, MMM, poor dentition PULM: CTAB w/o wheezes/crackles, normal respiratory effort, on room air with SPO2 100% at rest CV: RRR w/o M/G/R GI: abd soft, NTND, NABS, no R/G/M MSK: Trace bilateral lower extremity peripheral edema up to mid shin, moves all extremities independently NEURO: CN II-XII intact, no focal deficits, sensation to light touch intact PSYCH: normal mood/affect Integumentary: dry/intact, no rashes or wounds    The results of significant diagnostics from this hospitalization (including imaging, microbiology, ancillary and laboratory) are listed below for reference.     Microbiology: Recent Results (from the past 240 hour(s))  Blood Culture (routine x 2)     Status: None (Preliminary result)   Collection Time: 07/22/21  9:11 AM   Specimen: BLOOD  Result Value Ref Range Status   Specimen Description BLOOD BLOOD LEFT WRIST  Final   Special Requests   Final    BOTTLES DRAWN AEROBIC AND ANAEROBIC Blood Culture adequate volume   Culture   Final    NO GROWTH 3 DAYS Performed at [facility], [address] [place], [place] [address]    Report Status PENDING  Incomplete  SARS Coronavirus 2 by RT PCR (hospital order, performed in [facility] hospital lab) *cepheid single result test* Anterior Nasal Swab     Status: None   Collection Time: 07/22/21  9:20 AM   Specimen: Anterior Nasal Swab  Result Value Ref Range Status   SARS Coronavirus 2 by RT PCR NEGATIVE NEGATIVE Final    Comment: (NOTE) SARS-CoV-2 target nucleic acids are NOT DETECTED.  The SARS-CoV-2 RNA is generally detectable in upper and lower respiratory specimens during the acute phase of infection. The lowest concentration of SARS-CoV-2 viral copies this assay can detect is 250 copies / mL. A negative result  does not preclude SARS-CoV-2 infection and should not be used as the sole basis for treatment or other patient management decisions.  A negative result may occur with improper specimen collection / handling, submission of specimen other than nasopharyngeal swab, presence of viral mutation(s) within the areas targeted by this assay, and inadequate number of viral copies (<250 copies / mL). A negative result must be combined with clinical observations, patient history, and epidemiological information.  Fact Sheet for Patients:   https://www.patel.info/  Fact Sheet for Healthcare Providers: https://hall.com/  This test is not yet approved or  cleared by the [place] FDA and has been authorized for detection and/or diagnosis of SARS-CoV-2 by FDA under an Emergency Use Authorization (EUA).  This EUA will remain in effect (meaning this test can be used) for the duration of the COVID-19 declaration under Section 564(b)(1) of the Act, 21 U.S.C. section 360bbb-3(b)(1), unless the authorization is terminated or revoked  sooner.  Performed at [facility], [place] [address], [place], [place] [address]   Blood Culture (routine x 2)     Status: None (Preliminary result)   Collection Time: 07/22/21 10:13 AM   Specimen: BLOOD  Result Value Ref Range Status   Specimen Description BLOOD BLOOD RIGHT HAND  Final   Special Requests   Final    BOTTLES DRAWN AEROBIC AND ANAEROBIC Blood Culture adequate volume   Culture   Final    NO GROWTH 3 DAYS Performed at [facility], [place] [address] [place], [place] [address]    Report Status PENDING  Incomplete     Labs: BNP (last 3 results) No results for input(s): "BNP" in the last 8760 hours. Basic Metabolic Panel: Recent Labs  Lab 07/22/21 0915 07/23/21 0208  NA 139 141  K 3.6 3.9  CL 106 109  CO2 25 28  GLUCOSE 99 96  BUN 14 13  CREATININE 1.05 1.05  CALCIUM 9.2 9.1   Liver Function  Tests: Recent Labs  Lab 07/22/21 0915  AST 24  ALT 18  ALKPHOS 56  BILITOT 1.1  PROT 6.2*  ALBUMIN 3.2*   No results for input(s): "LIPASE", "AMYLASE" in the last 168 hours. No results for input(s): "AMMONIA" in the last 168 hours. CBC: Recent Labs  Lab 07/22/21 0915 07/23/21 0208  WBC 8.2 4.4  NEUTROABS 6.9  --   HGB 11.2* 10.6*  HCT 33.4* 32.0*  MCV 94.6 95.2  PLT 78* 79*   Cardiac Enzymes: No results for input(s): "CKTOTAL", "CKMB", "CKMBINDEX", "TROPONINI" in the last 168 hours. BNP: Invalid input(s): "POCBNP" CBG: No results for input(s): "GLUCAP" in the last 168 hours. D-Dimer No results for input(s): "DDIMER" in the last 72 hours. Hgb A1c No results for input(s): "HGBA1C" in the last 72 hours. Lipid Profile No results for input(s): "CHOL", "HDL", "LDLCALC", "TRIG", "CHOLHDL", "LDLDIRECT" in the last 72 hours. Thyroid function studies No results for input(s): "TSH", "T4TOTAL", "T3FREE", "THYROIDAB" in the last 72 hours.  Invalid input(s): "FREET3" Anemia work up No results for input(s): "VITAMINB12", "FOLATE", "FERRITIN", "TIBC", "IRON", "RETICCTPCT" in the last 72 hours. Urinalysis No results found for: "COLORURINE", "APPEARANCEUR", "LABSPEC", "PHURINE", "GLUCOSEU", "HGBUR", "BILIRUBINUR", "KETONESUR", "PROTEINUR", "UROBILINOGEN", "NITRITE", "LEUKOCYTESUR" Sepsis Labs Recent Labs  Lab 07/22/21 0915 07/23/21 0208  WBC 8.2 4.4   Microbiology Recent Results (from the past 240 hour(s))  Blood Culture (routine x 2)     Status: None (Preliminary result)   Collection Time: 07/22/21  9:11 AM   Specimen: BLOOD  Result Value Ref Range Status   Specimen Description BLOOD BLOOD LEFT WRIST  Final   Special Requests   Final    BOTTLES DRAWN AEROBIC AND ANAEROBIC Blood Culture adequate volume   Culture   Final    NO GROWTH 3 DAYS Performed at [facility], [place] [address], [place], [place] [address]    Report Status PENDING  Incomplete  SARS Coronavirus  2 by RT PCR (hospital order, performed in [facility] hospital lab) *cepheid single result test* Anterior Nasal Swab     Status: None   Collection Time: 07/22/21  9:20 AM   Specimen: Anterior Nasal Swab  Result Value Ref Range Status   SARS Coronavirus 2 by RT PCR NEGATIVE NEGATIVE Final    Comment: (NOTE) SARS-CoV-2 target nucleic acids are NOT DETECTED.  The SARS-CoV-2 RNA is generally detectable in upper and lower respiratory specimens during the acute phase of infection. The lowest concentration  of SARS-CoV-2 viral copies this assay can detect is 250 copies / mL. A negative result does not preclude SARS-CoV-2 infection and should not be used as the sole basis for treatment or other patient management decisions.  A negative result may occur with improper specimen collection / handling, submission of specimen other than nasopharyngeal swab, presence of viral mutation(s) within the areas targeted by this assay, and inadequate number of viral copies (<250 copies / mL). A negative result must be combined with clinical observations, patient history, and epidemiological information.  Fact Sheet for Patients:   https://www.patel.info/  Fact Sheet for Healthcare Providers: https://hall.com/  This test is not yet approved or  cleared by the [place] FDA and has been authorized for detection and/or diagnosis of SARS-CoV-2 by FDA under an Emergency Use Authorization (EUA).  This EUA will remain in effect (meaning this test can be used) for the duration of the COVID-19 declaration under Section 564(b)(1) of the Act, 21 U.S.C. section 360bbb-3(b)(1), unless the authorization is terminated or revoked sooner.  Performed at [facility], [place] [address], [place], [place] [address]   Blood Culture (routine x 2)     Status: None (Preliminary result)   Collection Time: 07/22/21 10:13 AM   Specimen: BLOOD  Result Value Ref Range Status    Specimen Description BLOOD BLOOD RIGHT HAND  Final   Special Requests   Final    BOTTLES DRAWN AEROBIC AND ANAEROBIC Blood Culture adequate volume   Culture   Final    NO GROWTH 3 DAYS Performed at [facility], [place] [address] [place], [place] [address]    Report Status PENDING  Incomplete     Time coordinating discharge: Over 30 minutes  SIGNED:   Massimo Hartland J [place] DO  [facility] 07/25/2021, 9:37 AM

## 2021-07-25 NOTE — Progress Notes (Signed)
PHARMACY ANTIBIOTIC CONSULT NOTE   Shaheed Schmuck a 88 y.o. male admitted on 07/25/2021 with rhinorrhea/cough/weaknes .  Pharmacy has been consulted for cefepime dosing.  07/25/2021: Scr 1.05, LA 1.3,  WBC 8.2  Vital Signs: afebrile, HR slow (40s-60s), BP WNL  Estimated Creatinine Clearance: 54.7 mL/min (by C-G formula based on SCr of 1.05 mg/dL).  Day 4 of abx, possible discharge with home health today per MD note. Would recommend one more day of PO abx on discharge (Cefdinir 300mg  BID and azithromycin 500mg  PO daily x 1)  Plan: Cefepime 2gm IV q12h Azithromycin 500 mg IV daily per MD  Monitor renal function, clinical status, C/S, de-escalation   Allergies:  No Known Allergies  Filed Weights   07/22/21 0912  Weight: 97 kg (213 lb 13.5 oz)       Latest Ref Rng & Units 07/23/2021    2:08 AM 07/22/2021    9:15 AM 09/10/2019    2:31 PM  CBC  WBC 4.0 - 10.5 K/uL 4.4  8.2  4.7   Hemoglobin 13.0 - 17.0 g/dL 09/21/2021  09/12/2019  62.2   Hematocrit 39.0 - 52.0 % 32.0  33.4  36.1   Platelets 150 - 400 K/uL 79  78  128     Antimicrobials this admission: Cefepime 6/9 >>  Azithromycin 6/9 >> CTX 6/9 x1   Microbiology results: 07/25/2021 Bcx: sent 07/25/2021 Resp Cx: sent   Carmyn Hamm A. 09/24/2021, PharmD, BCPS, FNKF Clinical Pharmacist [place] Please utilize Amion for appropriate phone number to reach the unit pharmacist [facility])  07/25/2021 8:21 AM

## 2021-07-25 NOTE — NC FL2 (Deleted)
[place] LEVEL OF CARE SCREENING TOOL     IDENTIFICATION  Patient Name: Roger Briggs Birthdate: May 23, 1929 Sex: male Admission Date (Current Location): 07/22/2021  [facility] and [place] Number:  Herbalist and Address:  The [place]. [facility] [place] [address] [place], [place] [address]      Provider Number: [id number]  Attending Physician Name and Address:  [place] Eric J, DO  Relative Name and Phone Number:  Pryor Ochoa Daughter [phone]    Current Level of Care: Hospital Recommended Level of Care: [place] Prior Approval Number:    Date Approved/Denied:   PASRR Number:    Discharge Plan: Other (Comment) ([facility] assisted living)    Current Diagnoses: Patient Active Problem List   Diagnosis Date Noted   Thrombocytopenia ([place]) 07/23/2021   CAP (community acquired pneumonia) 07/22/2021   Sinus bradycardia 07/22/2021   Dementia without behavioral disturbance ([place]) 07/22/2021   Essential hypertension 07/22/2021   Thoracic aortic aneurysm ([place]) 07/22/2021   DNR (do not resuscitate) 07/22/2021   Hyperthyroidism 12/10/2017    Orientation RESPIRATION BLADDER Height & Weight     Self  Normal Incontinent, External catheter Weight: 213 lb 13.5 oz (97 kg) Height:  6' 1.5" (186.7 cm)  BEHAVIORAL SYMPTOMS/MOOD NEUROLOGICAL BOWEL NUTRITION STATUS      Continent Diet (regular diet)  AMBULATORY STATUS COMMUNICATION OF NEEDS Skin   Limited Assist Verbally Normal, Other (Comment) (redness)                       Personal Care Assistance Level of Assistance  Bathing, Feeding, Dressing Bathing Assistance: Limited assistance Feeding assistance: Limited assistance Dressing Assistance: Maximum assistance     Functional Limitations Info  Sight, Hearing, Speech Sight Info: Adequate Hearing Info: Adequate Speech Info: Adequate    SPECIAL CARE FACTORS FREQUENCY  PT (By licensed PT), OT (By licensed OT)      PT Frequency: evaluate and treat OT Frequency: evaluate and treat            Contractures Contractures Info: Not present    Additional Factors Info  Code Status, Allergies Code Status Info: DNR Allergies Info: NKA           Current Medications (07/25/2021):  This is the current hospital active medication list Current Facility-Administered Medications  Medication Dose Route Frequency Provider Last Rate Last Admin   acetaminophen (TYLENOL) tablet 650 mg  650 mg Oral Q6H PRN Karmen Bongo, MD   650 mg at 07/25/21 0045   Or   acetaminophen (TYLENOL) suppository 650 mg  650 mg Rectal Q6H PRN Karmen Bongo, MD       albuterol (PROVENTIL) (2.5 MG/3ML) 0.083% nebulizer solution 2.5 mg  2.5 mg Nebulization Q2H PRN Karmen Bongo, MD       azithromycin (ZITHROMAX) 500 mg in sodium chloride 0.9 % 250 mL IVPB  500 mg Intravenous Q24H Karmen Bongo, MD 250 mL/hr at 07/24/21 1228 500 mg at 07/24/21 1228   bisacodyl (DULCOLAX) EC tablet 5 mg  5 mg Oral Daily PRN Karmen Bongo, MD       ceFEPIme (MAXIPIME) 2 g in sodium chloride 0.9 % 100 mL IVPB  2 g Intravenous Q12H Paytes, Austin A, RPH 200 mL/hr at 07/25/21 0938 2 g at 07/25/21 0938   docusate sodium (COLACE) capsule 100 mg  100 mg Oral BID Karmen Bongo, MD   100 mg at 07/25/21 0933   guaiFENesin (MUCINEX) 12 hr tablet 600 mg  600 mg Oral BID PRN Karmen Bongo, MD   600 mg at 07/25/21 0045   hydrALAZINE (APRESOLINE) injection 10 mg  10 mg Intravenous Q4H PRN Adefeso, Oladapo, DO   10 mg at 07/25/21 0038   lisinopril (ZESTRIL) tablet 40 mg  40 mg Oral Ivery Quale, MD   40 mg at 07/24/21 2110   loratadine (CLARITIN) tablet 10 mg  10 mg Oral Daily Karmen Bongo, MD   10 mg at 07/25/21 0933   methimazole (TAPAZOLE) tablet 5 mg  5 mg Oral Daily Karmen Bongo, MD   5 mg at 07/25/21 0933   morphine (PF) 2 MG/ML injection 2 mg  2 mg Intravenous Q2H PRN Karmen Bongo, MD       ondansetron [facility]) tablet 4 mg  4 mg Oral  Q6H PRN Karmen Bongo, MD       Or   ondansetron [facility]) injection 4 mg  4 mg Intravenous Q6H PRN Karmen Bongo, MD       oxyCODONE (Oxy IR/ROXICODONE) immediate release tablet 5 mg  5 mg Oral Q4H PRN Karmen Bongo, MD       polyethylene glycol (MIRALAX / GLYCOLAX) packet 17 g  17 g Oral Daily PRN Karmen Bongo, MD       sodium chloride flush (NS) 0.9 % injection 3 mL  3 mL Intravenous Q12H Karmen Bongo, MD   3 mL at 07/25/21 0938   traZODone (DESYREL) tablet 25 mg  25 mg Oral Ivery Quale, MD   25 mg at 07/24/21 2110     Discharge Medications: Please see discharge summary for a list of discharge medications.  Relevant Imaging Results:  Relevant Lab Results:   Additional Information SSN: [id number]  Joanne Chars, LCSW

## 2021-07-25 NOTE — TOC Initial Note (Signed)
Transition of Care [facility]) - Initial/Assessment Note    Patient Details  Name: Roger Briggs MRN: [id number] Date of Birth: 1929/11/30  Transition [facility] Care [facility]) CM/SW Contact:    Lorri Frederick, LCSW Phone Number: 07/25/2021, 9:56 AM  Clinical Narrative:     CSW informed pt ready for DC back.  Pt oriented x1, CSW spoke with pt and daughter Liborio Nixon in room.  Pt from [facility] and Liborio Nixon does want him to return, agreeable to PT services there.    CSW LM with [facility], awaiting return call.                Expected Discharge Plan: Assisted Living ([facility]) Barriers to Discharge: No Barriers Identified   Patient Goals and CMS Choice     Choice offered to / list presented to : Adult Children (daughter Liborio Nixon)  Expected Discharge Plan and Services Expected Discharge Plan: Assisted Living ([facility]) In-house Referral: Clinical Social Work   Post Acute Care Choice: Home Health Living arrangements for the past 2 months: Assisted Living Facility ([facility]) Expected Discharge Date: 07/25/21                         HH Arranged: PT HH Agency: [facility] Date HH Agency Contacted: 07/25/21 Time HH Agency Contacted: [phone] Representative spoke with at [facility] Agency: Clifton Custard  Prior Living Arrangements/Services Living arrangements for the past 2 months: Assisted Living Facility Science writer) Lives with:: Facility Resident Patient language and need for interpreter reviewed:: Yes        Need for Family Participation in Patient Care: Yes (Comment) Care giver support system in place?: Yes (comment) Current home services: Other (comment) (none) Criminal Activity/Legal Involvement Pertinent to Current Situation/Hospitalization: No - Comment as needed  Activities of Daily Living Home Assistive Devices/Equipment: Walker (specify type) ADL Screening (condition at time of admission) Patient's cognitive ability adequate to safely complete daily  activities?: No Is the patient deaf or have difficulty hearing?: Yes Does the patient have difficulty seeing, even when wearing glasses/contacts?: Yes Does the patient have difficulty concentrating, remembering, or making decisions?: Yes Patient able to express need for assistance with ADLs?: No Does the patient have difficulty dressing or bathing?: Yes Independently performs ADLs?: No Communication: Appropriate for developmental age Dressing (OT): Needs assistance Is this a change from baseline?: Pre-admission baseline Grooming: Needs assistance Is this a change from baseline?: Pre-admission baseline Feeding: Independent Bathing: Needs assistance Is this a change from baseline?: Pre-admission baseline Toileting: Needs assistance Is this a change from baseline?: Pre-admission baseline In/Out Bed: Needs assistance Is this a change from baseline?: Pre-admission baseline Walks in Home: Needs assistance Is this a change from baseline?: Pre-admission baseline Does the patient have difficulty walking or climbing stairs?: Yes Weakness of Legs: Both Weakness of Arms/Hands: Both  Permission Sought/Granted Permission sought to share information with : Family Supports Permission granted to share information with : Yes, Verbal Permission Granted  Share Information with NAME: daughter Liborio Nixon           Emotional Assessment Appearance:: Appears stated age Attitude/Demeanor/Rapport: Engaged Affect (typically observed): Pleasant Orientation: : Oriented to Self      Admission diagnosis:  CAP (community acquired pneumonia) [J18.9] Community acquired pneumonia of right lower lobe of lung [J18.9] Patient Active Problem List   Diagnosis Date Noted   Thrombocytopenia (HCC) 07/23/2021   CAP (community acquired pneumonia) 07/22/2021   Sinus bradycardia 07/22/2021   Dementia without behavioral disturbance (HCC)  07/22/2021   Essential hypertension 07/22/2021   Thoracic aortic aneurysm (HCC)  07/22/2021   DNR (do not resuscitate) 07/22/2021   Hyperthyroidism 12/10/2017   PCP:  Oneita Hurt, No Pharmacy:   [facility] - [place], [place] - [address] [place] [address] Phone: [phone] Fax: [phone]     Social Determinants of Health (SDOH) Interventions    Readmission Risk Interventions     No data to display

## 2021-07-25 NOTE — Care Management Important Message (Signed)
Important Message  Patient Details  Name: Roger Briggs MRN: [id number] Date of Birth: [birth year]   Medicare Important Message Given:  Yes     [name] Stefan Church 07/25/2021, 4:09 PM

## 2021-07-25 NOTE — TOC Transition Note (Signed)
Transition of Care [facility]) - CM/SW Discharge Note   Patient Details  Name: Roger Briggs MRN: [id number] Date of Birth: 09/24/1929  Transition of [facility]) CM/SW Contact:  Joanne Chars, LCSW Phone Number: 07/25/2021, 12:28 PM   Clinical Narrative:   Pt discharging to [place].  RN call [phone] for report.     Final next level of care: Assisted Living Barriers to Discharge: No Barriers Identified   Patient Goals and CMS Choice     Choice offered to / list presented to : Adult Children (daughter Thayer Headings)  Discharge Placement              Patient chooses bed at:  [facility]) Patient to be transferred to facility by: [place] Name of family member notified: daughter Thayer Headings in room Patient and family notified of of transfer: 07/25/21  Discharge Plan and Services In-house Referral: Clinical Social Work   Post Acute Care Choice: Home Health                    HH Arranged: PT [facility] Agency: [place] Date [place]: 07/25/21 Time [place]: [phone] Representative spoke with at [place]: [place] ([place]) Interventions     Readmission Risk Interventions     No data to display

## 2021-07-25 NOTE — NC FL2 (Signed)
[place] LEVEL OF CARE SCREENING TOOL     IDENTIFICATION  Patient Name: Roger Briggs Birthdate: 04-16-1929 Sex: male Admission Date (Current Location): 07/22/2021  [facility] and [place] Number:  Herbalist and Address:  The [place]. [facility] [place] [address] [place], [place] [address]      Provider Number: [id number]  Attending Physician Name and Address:  [place] Eric J, DO  Relative Name and Phone Number:  Pryor Ochoa Daughter [phone]    Current Level of Care: Hospital Recommended Level of Care: [place] Prior Approval Number:    Date Approved/Denied:   PASRR Number:    Discharge Plan: Other (Comment) ([facility] assisted living)    Current Diagnoses: Patient Active Problem List   Diagnosis Date Noted   Thrombocytopenia ([place]) 07/23/2021   CAP (community acquired pneumonia) 07/22/2021   Sinus bradycardia 07/22/2021   Dementia without behavioral disturbance ([place]) 07/22/2021   Essential hypertension 07/22/2021   Thoracic aortic aneurysm ([place]) 07/22/2021   DNR (do not resuscitate) 07/22/2021   Hyperthyroidism 12/10/2017    Orientation RESPIRATION BLADDER Height & Weight     Self  Normal Incontinent, External catheter Weight: 213 lb 13.5 oz (97 kg) Height:  6' 1.5" (186.7 cm)  BEHAVIORAL SYMPTOMS/MOOD NEUROLOGICAL BOWEL NUTRITION STATUS      Continent Diet (regular diet)  AMBULATORY STATUS COMMUNICATION OF NEEDS Skin   Limited Assist Verbally Normal, Other (Comment) (redness)                       Personal Care Assistance Level of Assistance  Bathing, Feeding, Dressing Bathing Assistance: Limited assistance Feeding assistance: Limited assistance Dressing Assistance: Maximum assistance     Functional Limitations Info  Sight, Hearing, Speech Sight Info: Adequate Hearing Info: Adequate Speech Info: Adequate    SPECIAL CARE FACTORS FREQUENCY  PT (By licensed PT), OT (By licensed OT)      PT Frequency: evaluate and treat OT Frequency: evaluate and treat            Contractures Contractures Info: Not present    Additional Factors Info  Code Status, Allergies Code Status Info: DNR Allergies Info: NKA           Current Medications (07/25/2021):  This is the current hospital active medication list Current Facility-Administered Medications  Medication Dose Route Frequency Provider Last Rate Last Admin   acetaminophen (TYLENOL) tablet 650 mg  650 mg Oral Q6H PRN Karmen Bongo, MD   650 mg at 07/25/21 0045   Or   acetaminophen (TYLENOL) suppository 650 mg  650 mg Rectal Q6H PRN Karmen Bongo, MD       albuterol (PROVENTIL) (2.5 MG/3ML) 0.083% nebulizer solution 2.5 mg  2.5 mg Nebulization Q2H PRN Karmen Bongo, MD       azithromycin (ZITHROMAX) 500 mg in sodium chloride 0.9 % 250 mL IVPB  500 mg Intravenous Q24H Karmen Bongo, MD 250 mL/hr at 07/25/21 1021 500 mg at 07/25/21 1021   bisacodyl (DULCOLAX) EC tablet 5 mg  5 mg Oral Daily PRN Karmen Bongo, MD       ceFEPIme (MAXIPIME) 2 g in sodium chloride 0.9 % 100 mL IVPB  2 g Intravenous Q12H Paytes, Austin A, RPH 200 mL/hr at 07/25/21 0938 2 g at 07/25/21 0938   docusate sodium (COLACE) capsule 100 mg  100 mg Oral BID Karmen Bongo, MD   100 mg at 07/25/21 0933   guaiFENesin (MUCINEX) 12 hr tablet 600 mg  600 mg Oral BID PRN Karmen Bongo, MD   600 mg at 07/25/21 0045   hydrALAZINE (APRESOLINE) injection 10 mg  10 mg Intravenous Q4H PRN Adefeso, Oladapo, DO   10 mg at 07/25/21 0038   lisinopril (ZESTRIL) tablet 40 mg  40 mg Oral Ivery Quale, MD   40 mg at 07/24/21 2110   loratadine (CLARITIN) tablet 10 mg  10 mg Oral Daily Karmen Bongo, MD   10 mg at 07/25/21 0933   methimazole (TAPAZOLE) tablet 5 mg  5 mg Oral Daily Karmen Bongo, MD   5 mg at 07/25/21 0933   morphine (PF) 2 MG/ML injection 2 mg  2 mg Intravenous Q2H PRN Karmen Bongo, MD       ondansetron [facility]) tablet 4 mg  4 mg Oral  Q6H PRN Karmen Bongo, MD       Or   ondansetron [facility]) injection 4 mg  4 mg Intravenous Q6H PRN Karmen Bongo, MD       oxyCODONE (Oxy IR/ROXICODONE) immediate release tablet 5 mg  5 mg Oral Q4H PRN Karmen Bongo, MD       polyethylene glycol (MIRALAX / GLYCOLAX) packet 17 g  17 g Oral Daily PRN Karmen Bongo, MD       sodium chloride flush (NS) 0.9 % injection 3 mL  3 mL Intravenous Q12H Karmen Bongo, MD   3 mL at 07/25/21 0938   traZODone (DESYREL) tablet 25 mg  25 mg Oral Ivery Quale, MD   25 mg at 07/24/21 2110     Discharge Medications: Please see discharge summary for a list of discharge medications.  Relevant Imaging Results:  Relevant Lab Results:   Additional Information SSN: [id number]  Joanne Chars, LCSW

## 2021-07-27 LAB — CULTURE, BLOOD (ROUTINE X 2)
Culture: NO GROWTH
Culture: NO GROWTH
Special Requests: ADEQUATE
Special Requests: ADEQUATE

## 2021-08-10 NOTE — Progress Notes (Unsigned)
Cardiology Office Note:   Date:  08/11/2021  NAME:  Roger Briggs    MRN: [id number] DOB:  1929/05/02   PCP:  Oneita Hurt, No  Cardiologist:  Reatha Harps, MD  Electrophysiologist:  None   Referring MD: No ref. provider found   Chief Complaint  Patient presents with   Follow-up        History of Present Illness:   Roger Briggs is a 88 y.o. male with a hx of dementia, Afib, HFpEF who presents for follow-up. Recent admission for PNA. Completed monitor for bradycardia. No significant pauses.  Recent admission to the hospital earlier this month for pneumonia.  He was on the monitor.  Had some bradycardia but no significant symptoms per what is documented.  Now working with physical therapy and Occupational Therapy.  He presents with 2 of his daughters.  Symptoms are improving.  Recent monitor showed no significant pauses.  He did have bradycardia but his heart rate can increase to 100.  We discussed that would recommend to continue to monitor this for now.  He overall is improving from pneumonia.  Energy is improving.  I do not believe he has significant bradycardia.  He had nothing to suggest complete heart block.  We will just continue to monitor this for now.  They are in agreement.  We did discuss risk of benefits of pacemaker therapy.  Overall do not believe he is in the benefit range I believe risk could be greater than benefit.  Euvolemic on exam.  Volume status is quite acceptable.  Blood pressure is controlled as well.  Problem List 1. Hyperthyroidism on methimazole 2. Dementia 3. HTN 4. AAA s/p repair  5. Falls/Subdural hematoma  6. Atrial fibrillation with SVR -no AC due to falls and prior SDH 7. HFpEF, 60-65% -BNP 280  Past Medical History: Past Medical History:  Diagnosis Date   HTN (hypertension)    Hyperthyroidism    Mild cognitive impairment     Past Surgical History: Past Surgical History:  Procedure Laterality Date   ABDOMINAL AORTIC ANEURYSM REPAIR       Current Medications: Current Meds  Medication Sig   acetaminophen (TYLENOL) 500 MG tablet Take 500 mg by mouth 3 (three) times daily as needed (pain, fever > 100.5).   betamethasone valerate lotion (VALISONE) 0.1 % Apply 1 application  topically 2 (two) times daily as needed (itching, eczema).   Cholecalciferol (VITAMIN D3) 50 MCG (2000 UT) capsule Take 2,000 Units by mouth daily.   Emollient (CERAVE DAILY MOISTURIZING) LOTN Apply 1 Application topically daily.   FEROSUL 325 (65 Fe) MG tablet Take 325 mg by mouth every Monday, Wednesday, and Friday.   furosemide (LASIX) 20 MG tablet Take 1 tablet (20 mg total) by mouth daily.   lisinopril (PRINIVIL,ZESTRIL) 40 MG tablet Take 40 mg by mouth at bedtime.   loratadine (CLARITIN) 10 MG tablet Take 10 mg by mouth daily.   methimazole (TAPAZOLE) 5 MG tablet Take 5 mg by mouth daily.   OVER THE COUNTER MEDICATION Take 20 mLs by mouth every 4 (four) hours as needed (Congestion). Robitussin Maximum Strength   traZODone (DESYREL) 50 MG tablet Take 25 mg by mouth at bedtime.    vitamin B-12 (CYANOCOBALAMIN) 1000 MCG tablet Take 1,000 mcg by mouth daily.     Allergies:    Patient has no known allergies.   Social History: Social History   Socioeconomic History   Marital status: Widowed    Spouse name: Not on file  Number of children: 5   Years of education: Not on file   Highest education level: Not on file  Occupational History   Occupation: OB Gyn    Comment: retired  Tobacco Use   Smoking status: Former    Types: Pipe    Quit date: 2019    Years since quitting: 4.4    Passive exposure: Never   Smokeless tobacco: Never  Vaping Use   Vaping Use: Never used  Substance and Sexual Activity   Alcohol use: Not Currently   Drug use: Never   Sexual activity: Not Currently  Other Topics Concern   Not on file  Social History Narrative   Not on file   Social Determinants of Health   Financial Resource Strain: Not on file  Food  Insecurity: Not on file  Transportation Needs: Not on file  Physical Activity: Not on file  Stress: Not on file  Social Connections: Not on file     Family History: The patient's family history includes Depression in his daughter; Hypertension in his daughter.  ROS:   All other ROS reviewed and negative. Pertinent positives noted in the HPI.     EKGs/Labs/Other Studies Reviewed:   The following studies were personally reviewed by me today:   [facility] 05/05/2021 Impression: 1. 100% Afib burden. Average HR 49 bpm.  2. One nocturnal pause occurred ~3 seconds.  3. Rare ectopy.   Recent Labs: 07/22/2021: ALT 18 07/23/2021: BUN 13; Creatinine, Ser 1.05; Hemoglobin 10.6; Platelets 79; Potassium 3.9; Sodium 141   Recent Lipid Panel No results found for: "CHOL", "TRIG", "HDL", "CHOLHDL", "VLDL", "LDLCALC", "LDLDIRECT"  Physical Exam:   VS:  BP 135/79   Pulse (!) 56   Ht 6\' 1"  (1.854 m)   Wt 189 lb (85.7 kg)   SpO2 97%   BMI 24.94 kg/m    Wt Readings from Last 3 Encounters:  08/11/21 189 lb (85.7 kg)  07/22/21 213 lb 13.5 oz (97 kg)  06/21/21 214 lb (97.1 kg)    General: Well nourished, well developed, in no acute distress Head: Atraumatic, normal size  Eyes: PEERLA, EOMI  Neck: Supple, no JVD Endocrine: No thryomegaly Cardiac: Normal S1, S2; irregular rhythm Lungs: Clear to auscultation bilaterally, no wheezing, rhonchi or rales  Abd: Soft, nontender, no hepatomegaly  Ext: No edema, pulses 2+ Musculoskeletal: No deformities, BUE and BLE strength normal and equal Skin: Warm and dry, no rashes   Neuro: Alert and oriented to person, place, time, and situation, CNII-XII grossly intact, no focal deficits  Psych: Normal mood and affect   ASSESSMENT:   Roger Briggs is a 88 y.o. male who presents for the following: 1. Longstanding persistent atrial fibrillation ([place])   2. Bradycardia   3. Chronic diastolic heart failure (HCC)     PLAN:   1. Longstanding persistent atrial  fibrillation ([place]) 2. Bradycardia -Longstanding history of atrial fibrillation.  Not on anticoagulation due to fall risk and risk of bleeding.  We have decided this with the family.  He has significant bradycardia but no high risk findings on recent monitor.  Average heart rate 49.  Heart rate can increase up into the 100s.  He is getting over pneumonia diagnosis.  Energy level is slowly improving.  Overall do not believe he merits pacing at this time.  He had a pause of 3 seconds but nothing more than that.  Discussed that we will avoid any AV nodal agents and continue to monitor him for now.  I did  discuss that I do not believe he will benefit from pacing therapy currently.  Family is in agreement.  We will continue just to monitor this.  Energy is improving which is a reassuring sign.  3. Chronic diastolic heart failure (HCC) -Euvolemic on examination.  Continue Lasix 20 mg daily.      Disposition: Return in about 6 months (around 02/10/2022).  Medication Adjustments/Labs and Tests Ordered: Current medicines are reviewed at length with the patient today.  Concerns regarding medicines are outlined above.  No orders of the defined types were placed in this encounter.  No orders of the defined types were placed in this encounter.   Patient Instructions  Medication Instructions:  Your Physician recommend you continue on your current medication as directed.    *If you need a refill on your cardiac medications before your next appointment, please call your pharmacy*   Lab Work: None ordered today   Testing/Procedures: None ordered today   Follow-Up: At [facility], you and your health needs are our priority.  As part of our continuing mission to provide you with exceptional heart care, we have created designated Provider Care Teams.  These Care Teams include your primary Cardiologist (physician) and Advanced Practice Providers (APPs -  Physician Assistants and Nurse Practitioners)  who all work together to provide you with the care you need, when you need it.  We recommend signing up for the patient portal called "MyChart".  Sign up information is provided on this After Visit Summary.  MyChart is used to connect with patients for Virtual Visits (Telemedicine).  Patients are able to view lab/test results, encounter notes, upcoming appointments, etc.  Non-urgent messages can be sent to your provider as well.   To learn more about what you can do with MyChart, go to [email or web address].    Your next appointment:   6 month(s)  The format for your next appointment:   In Person  Provider:   Reatha Harps, MD {           Time Spent with Patient: I have spent a total of 25 minutes with patient reviewing hospital notes, telemetry, EKGs, labs and examining the patient as well as establishing an assessment and plan that was discussed with the patient.  > 50% of time was spent in direct patient care.  Signed, Lenna Gilford. Flora Lipps, MD, [facility]  [facility]  [address] [place], [place] [address] [phone]  08/11/2021 11:14 AM

## 2021-08-11 ENCOUNTER — Ambulatory Visit: Payer: Medicare Other | Admitting: Cardiovascular Disease

## 2021-08-11 ENCOUNTER — Encounter: Payer: Self-pay | Admitting: Cardiovascular Disease

## 2021-08-11 VITALS — BP 135/79 | HR 56 | Ht 73.0 in | Wt 189.0 lb

## 2021-08-11 DIAGNOSIS — I5032 Chronic diastolic (congestive) heart failure: Secondary | ICD-10-CM

## 2021-08-11 DIAGNOSIS — I4811 Longstanding persistent atrial fibrillation: Secondary | ICD-10-CM | POA: Diagnosis not present

## 2021-08-11 DIAGNOSIS — R001 Bradycardia, unspecified: Secondary | ICD-10-CM | POA: Diagnosis not present

## 2021-08-11 NOTE — Patient Instructions (Signed)

## 2021-09-19 ENCOUNTER — Emergency Department (HOSPITAL_COMMUNITY): Payer: Medicare Other

## 2021-09-19 ENCOUNTER — Other Ambulatory Visit: Payer: Self-pay

## 2021-09-19 ENCOUNTER — Encounter (HOSPITAL_COMMUNITY): Payer: Self-pay

## 2021-09-19 ENCOUNTER — Emergency Department (HOSPITAL_COMMUNITY)
Admission: EM | Admit: 2021-09-19 | Discharge: 2021-09-19 | Disposition: A | Discharge status: Home / Self Care | Payer: Medicare Other | Attending: Emergency Medicine | Admitting: Emergency Medicine

## 2021-09-19 DIAGNOSIS — F0390 Unspecified dementia without behavioral disturbance: Secondary | ICD-10-CM | POA: Insufficient documentation

## 2021-09-19 DIAGNOSIS — R4182 Altered mental status, unspecified: Secondary | ICD-10-CM | POA: Diagnosis present

## 2021-09-19 DIAGNOSIS — R410 Disorientation, unspecified: Secondary | ICD-10-CM | POA: Diagnosis not present

## 2021-09-19 DIAGNOSIS — W19XXXA Unspecified fall, initial encounter: Secondary | ICD-10-CM | POA: Diagnosis not present

## 2021-09-19 DIAGNOSIS — R2243 Localized swelling, mass and lump, lower limb, bilateral: Secondary | ICD-10-CM | POA: Diagnosis not present

## 2021-09-19 LAB — URINALYSIS, ROUTINE W REFLEX MICROSCOPIC
Bilirubin Urine: NEGATIVE
Glucose, UA: NEGATIVE mg/dL
Ketones, ur: NEGATIVE mg/dL
Leukocytes,Ua: NEGATIVE
Nitrite: NEGATIVE
Protein, ur: NEGATIVE mg/dL
Specific Gravity, Urine: 1.014 (ref 1.005–1.030)
pH: 5 (ref 5.0–8.0)

## 2021-09-19 LAB — CBC WITH DIFFERENTIAL/PLATELET
Abs Immature Granulocytes: 0.01 10*3/uL (ref 0.00–0.07)
Basophils Absolute: 0 10*3/uL (ref 0.0–0.1)
Basophils Relative: 0 %
Eosinophils Absolute: 0.2 10*3/uL (ref 0.0–0.5)
Eosinophils Relative: 4 %
HCT: 37.6 % — ABNORMAL LOW (ref 39.0–52.0)
Hemoglobin: 12.3 g/dL — ABNORMAL LOW (ref 13.0–17.0)
Immature Granulocytes: 0 %
Lymphocytes Relative: 23 %
Lymphs Abs: 1 10*3/uL (ref 0.7–4.0)
MCH: 31 pg (ref 26.0–34.0)
MCHC: 32.7 g/dL (ref 30.0–36.0)
MCV: 94.7 fL (ref 80.0–100.0)
Monocytes Absolute: 0.4 10*3/uL (ref 0.1–1.0)
Monocytes Relative: 8 %
Neutro Abs: 3 10*3/uL (ref 1.7–7.7)
Neutrophils Relative %: 65 %
Platelets: 116 10*3/uL — ABNORMAL LOW (ref 150–400)
RBC: 3.97 MIL/uL — ABNORMAL LOW (ref 4.22–5.81)
RDW: 12.8 % (ref 11.5–15.5)
WBC: 4.6 10*3/uL (ref 4.0–10.5)
nRBC: 0 % (ref 0.0–0.2)

## 2021-09-19 LAB — BASIC METABOLIC PANEL
Anion gap: 8 (ref 5–15)
BUN: 14 mg/dL (ref 8–23)
CO2: 26 mmol/L (ref 22–32)
Calcium: 10.1 mg/dL (ref 8.9–10.3)
Chloride: 108 mmol/L (ref 98–111)
Creatinine, Ser: 1.05 mg/dL (ref 0.61–1.24)
GFR, Estimated: >60 mL/min (ref >60)
Glucose, Bld: 93 mg/dL (ref 70–99)
Potassium: 3.9 mmol/L (ref 3.5–5.1)
Sodium: 142 mmol/L (ref 135–145)

## 2021-09-19 NOTE — Discharge Instructions (Addendum)
You were seen in the emergency department for evaluation of a fall.  You had a CAT scan of your head and neck, chest x-ray, lab work urinalysis that did not show any significant abnormalities.  Please continue your regular medications and follow-up with your primary care doctor.  Return to the emergency department if any worsening or concerning symptoms

## 2021-09-19 NOTE — ED Notes (Signed)
The daughter reports she will drive him back to [facility]. Pt dressed and was wheeled to car. Pts daughter verbalized understanding of d/c instructions and follow up care.

## 2021-09-19 NOTE — ED Triage Notes (Signed)
Pt bib ems for unwitnessed fall w/ new onset AMS per ems.  Pt A&Ox2.  Pt denies pain upon arrival.  Pt's pupils equal and reactive. Bilateral grip strong and equal.

## 2021-09-19 NOTE — ED Provider Notes (Signed)
[facility]-EMERGENCY DEPT Provider Note   CSN: [id number] Arrival date & time: 09/19/21  1308     History {Add pertinent medical, surgical, social history, OB history to HPI:1} Chief Complaint  Patient presents with   Fall   Altered Mental Status    Roger Briggs is a 88 y.o. male.  He is presenting by EMS after an unwitnessed fall at his facility.  Patient has no recollection of fall.  He is denying any complaints.  He denies any headache chest pain abdominal pain.  He does not know what day it is.  Not on blood thinners.  Unclear of his baseline mental status but EMS felt he was not oriented.  The history is provided by the patient and the EMS personnel.  Fall This is a recurrent problem. The problem has not changed since onset.Pertinent negatives include no chest pain, no abdominal pain, no headaches and no shortness of breath. Nothing aggravates the symptoms. Nothing relieves the symptoms. He has tried nothing for the symptoms. The treatment provided no relief.       Home Medications Prior to Admission medications   Medication Sig Start Date End Date Taking? Authorizing Provider  acetaminophen (TYLENOL) 500 MG tablet Take 500 mg by mouth 3 (three) times daily as needed (pain, fever > 100.5).    [provider]  betamethasone valerate lotion (VALISONE) 0.1 % Apply 1 application  topically 2 (two) times daily as needed (itching, eczema).    [provider]  Cholecalciferol (VITAMIN D3) 50 MCG (2000 UT) capsule Take 2,000 Units by mouth daily.    [provider]  Emollient (CERAVE DAILY MOISTURIZING) LOTN Apply 1 Application topically daily. 08/21/19   [provider]  FEROSUL 325 (65 Fe) MG tablet Take 325 mg by mouth every Monday, Wednesday, and Friday. 04/26/21   [provider]  furosemide (LASIX) 20 MG tablet Take 1 tablet (20 mg total) by mouth daily. 05/06/20 09/17/21  Sande Rives, MD  lisinopril  (PRINIVIL,ZESTRIL) 40 MG tablet Take 40 mg by mouth at bedtime.    [provider]  loratadine (CLARITIN) 10 MG tablet Take 10 mg by mouth daily. 08/06/19   [provider]  methimazole (TAPAZOLE) 5 MG tablet Take 5 mg by mouth daily. 08/20/19   [provider]  OVER THE COUNTER MEDICATION Take 20 mLs by mouth every 4 (four) hours as needed (Congestion). Robitussin Maximum Strength    [provider]  traZODone (DESYREL) 50 MG tablet Take 25 mg by mouth at bedtime.     [provider]  vitamin B-12 (CYANOCOBALAMIN) 1000 MCG tablet Take 1,000 mcg by mouth daily. 10/23/20   [provider]      Allergies    Patient has no known allergies.    Review of Systems   Review of Systems  Unable to perform ROS: Dementia  Eyes:  Negative for visual disturbance.  Respiratory:  Negative for shortness of breath.   Cardiovascular:  Negative for chest pain.  Gastrointestinal:  Negative for abdominal pain.  Musculoskeletal:  Negative for neck pain.  Neurological:  Negative for headaches.    Physical Exam Updated Vital Signs SpO2 95%  Physical Exam Vitals and nursing note reviewed.  Constitutional:      General: He is not in acute distress.    Appearance: Normal appearance. He is well-developed.  HENT:     Head: Normocephalic and atraumatic.  Eyes:     Conjunctiva/sclera: Conjunctivae normal.  Cardiovascular:  Rate and Rhythm: Normal rate and regular rhythm.     Heart sounds: No murmur heard. Pulmonary:     Effort: Pulmonary effort is normal. No respiratory distress.     Breath sounds: Normal breath sounds.  Abdominal:     Palpations: Abdomen is soft.     Tenderness: There is no abdominal tenderness. There is no guarding or rebound.  Musculoskeletal:        General: Normal range of motion.     Cervical back: Neck supple.     Right lower leg: Edema present.     Left lower leg: Edema present.     Comments: He has full range of motion of  his upper and lower extremities without any pain or limitations.  Trace edema bilateral lower extremities.  No open wounds noted  Skin:    General: Skin is warm and dry.     Capillary Refill: Capillary refill takes less than 2 seconds.  Neurological:     General: No focal deficit present.     Mental Status: He is alert. He is disoriented.     Sensory: No sensory deficit.     Motor: No weakness.     ED Results / Procedures / Treatments   Labs (all labs ordered are listed, but only abnormal results are displayed) Labs Reviewed - No data to display  EKG None  Radiology No results found.  Procedures Procedures  {Document cardiac monitor, telemetry assessment procedure when appropriate:1}  Medications Ordered in ED Medications - No data to display  ED Course/ Medical Decision Making/ A&P                           Medical Decision Making Amount and/or Complexity of Data Reviewed Labs: ordered. Radiology: ordered.   This patient complains of ***; this involves an extensive number of treatment Options and is a complaint that carries with it a high risk of complications and morbidity. The differential includes ***  I ordered, reviewed and interpreted labs, which included *** I ordered medication *** and reviewed PMP when indicated. I ordered imaging studies which included *** and I independently    visualized and interpreted imaging which showed *** Additional history obtained from *** Previous records obtained and reviewed *** I consulted *** and discussed lab and imaging findings and discussed disposition.  Cardiac monitoring reviewed, *** Social determinants considered, *** Critical Interventions: ***  After the interventions stated above, I reevaluated the patient and found *** Admission and further testing considered, ***   {Document critical care time when appropriate:1} {Document review of labs and clinical decision tools ie heart score, Chads2Vasc2 etc:1}   {Document your independent review of radiology images, and any outside records:1} {Document your discussion with family members, caretakers, and with consultants:1} {Document social determinants of health affecting pt's care:1} {Document your decision making why or why not admission, treatments were needed:1} Final Clinical Impression(s) / ED Diagnoses Final diagnoses:  None    Rx / DC Orders ED Discharge Orders     None

## 2021-09-19 NOTE — ED Notes (Signed)
Pt ambulated with walker and without assistants.

## 2021-09-19 NOTE — ED Notes (Signed)
[facility] has been called to transport the patient back to [facility]. [facility] states there are several ahead of him. Daughter at bedside and updated.

## 2022-01-26 ENCOUNTER — Ambulatory Visit: Payer: Medicare Other | Admitting: Cardiovascular Disease

## 2022-05-01 NOTE — Progress Notes (Signed)
Cardiology Office Note:   Date:  05/05/2022  NAME:  Roger Briggs    MRN: [id number] DOB:  01/28/1930   PCP:  Merryl Hacker, No  Cardiologist:  Evalina Field, MD  Electrophysiologist:  None   Referring MD: No ref. provider found   Chief Complaint  Patient presents with   Follow-up    6 months.   History of Present Illness:   Roger Briggs is a 89 y.o. male with a hx of permanent Afib who presents for follow-up.  He presents with his daughter.  Overall seems to be doing well.  He does have dementia.  Currently in dementia care.  Denies any chest pains or trouble breathing.  Pulse noted to be low but this is not new.  Pulse is routinely in the 40s.  Monitor last year showed his heart rate can increase to 101 bpm.  Nursing staff at the facility report he is a bit fatigued.  I did review his medications.  He had another patient's medication list in his chart.  I did encourage the daughter to follow-up on this to make sure he is not receiving carvedilol.  This was on the list.  His blood pressure is stable.  He is on Lasix every other day.  He seems to be doing well.  He reports no dizziness or lightheadedness.  No falls or syncope.  He seems to be quite stable and we again discussed there is no indication for pacing.  He is without symptoms and for now we can continue with a conservative approach.  Problem List 1. Hyperthyroidism on methimazole 2. Dementia 3. HTN 4. AAA s/p repair  5. Falls/Subdural hematoma  6. Atrial fibrillation with SVR -no AC due to falls and prior SDH 7. HFpEF, 60-65% -BNP 280  Past Medical History: Past Medical History:  Diagnosis Date   HTN (hypertension)    Hyperthyroidism    Mild cognitive impairment     Past Surgical History: Past Surgical History:  Procedure Laterality Date   ABDOMINAL AORTIC ANEURYSM REPAIR      Current Medications: Current Meds  Medication Sig   ASPIRIN 81 PO Take 81 mg by mouth daily.   atorvastatin (LIPITOR) 40 MG tablet Take 40  mg by mouth daily.   betamethasone valerate lotion (VALISONE) 0.1 % Apply 1 application  topically 2 (two) times daily as needed for irritation (to affected areas).   carvedilol (COREG CR) 10 MG 24 hr capsule Take 10 mg by mouth daily.   Cholecalciferol (VITAMIN D3) 50 MCG (2000 UT) capsule Take 2,000 Units by mouth daily.   Emollient (CERAVE DAILY MOISTURIZING) LOTN Apply 1 Application topically See admin instructions. Apply to affected areas 2 times a day and twice a day to affected areas of the face, as needed for irritation   ferrous sulfate 325 (65 FE) MG tablet Take 325 mg by mouth See admin instructions. Take 325 mg by mouth with food on Mon/Wed/Fri mornings   furosemide (LASIX) 20 MG tablet Take 1 tablet (20 mg total) by mouth daily. (Patient taking differently: Take 20 mg by mouth every other day.)   gabapentin (NEURONTIN) 300 MG capsule Take 300 mg by mouth 2 (two) times daily.   hydrALAZINE (APRESOLINE) 10 MG tablet Take 10 mg by mouth 3 (three) times daily.   lisinopril (PRINIVIL,ZESTRIL) 40 MG tablet Take 40 mg by mouth daily.   loratadine (CLARITIN) 10 MG tablet Take 10 mg by mouth daily.   losartan (COZAAR) 25 MG tablet Take 25 mg by  mouth daily.   TAPAZOLE 5 MG tablet Take 5 mg by mouth in the morning.   traZODone (DESYREL) 50 MG tablet Take 25 mg by mouth at bedtime.    vitamin B-12 (CYANOCOBALAMIN) 1000 MCG tablet Take 1,000 mcg by mouth daily.     Allergies:    Patient has no known allergies.   Social History: Social History   Socioeconomic History   Marital status: Widowed    Spouse name: Not on file   Number of children: 5   Years of education: Not on file   Highest education level: Not on file  Occupational History   Occupation: OB Gyn    Comment: retired  Tobacco Use   Smoking status: Former    Types: Pipe    Quit date: 2019    Years since quitting: 5.2    Passive exposure: Never   Smokeless tobacco: Never  Vaping Use   Vaping Use: Never used  Substance  and Sexual Activity   Alcohol use: Not Currently   Drug use: Never   Sexual activity: Not Currently  Other Topics Concern   Not on file  Social History Narrative   Not on file   Social Determinants of Health   Financial Resource Strain: Not on file  Food Insecurity: Not on file  Transportation Needs: Not on file  Physical Activity: Not on file  Stress: Not on file  Social Connections: Not on file     Family History: The patient's family history includes Depression in his daughter; Hypertension in his daughter.  ROS:   All other ROS reviewed and negative. Pertinent positives noted in the HPI.     EKGs/Labs/Other Studies Reviewed:   The following studies were personally reviewed by me today:  Recent Labs: 07/22/2021: ALT 18 09/19/2021: BUN 14; Creatinine, Ser 1.05; Hemoglobin 12.3; Platelets 116; Potassium 3.9; Sodium 142   Recent Lipid Panel No results found for: "CHOL", "TRIG", "HDL", "CHOLHDL", "VLDL", "LDLCALC", "LDLDIRECT"  Physical Exam:   VS:  BP (!) 122/50 (BP Location: Left Arm, Patient Position: Sitting, Cuff Size: Normal)   Pulse (!) 45   Ht 6\' 1"  (1.854 m)   Wt 193 lb (87.5 kg)   BMI 25.46 kg/m    Wt Readings from Last 3 Encounters:  05/05/22 193 lb (87.5 kg)  09/19/21 193 lb (87.5 kg)  08/11/21 189 lb (85.7 kg)    General: Well nourished, well developed, in no acute distress Head: Atraumatic, normal size  Eyes: PEERLA, EOMI  Neck: Supple, no JVD Endocrine: No thryomegaly Cardiac: Normal S1, S2; RRR; no murmurs, rubs, or gallops Lungs: Clear to auscultation bilaterally, no wheezing, rhonchi or rales  Abd: Soft, nontender, no hepatomegaly  Ext: No edema, pulses 2+ Musculoskeletal: No deformities, BUE and BLE strength normal and equal Skin: Warm and dry, no rashes   Neuro: Alert and oriented to person, place, time, and situation, CNII-XII grossly intact, no focal deficits  Psych: Normal mood and affect   ASSESSMENT:   Roger Briggs is a 89 y.o. male  who presents for the following: 1. Longstanding persistent atrial fibrillation ([place])   2. Chronic diastolic heart failure ([place])   3. Bradycardia   4. Essential hypertension     PLAN:   1. Longstanding persistent atrial fibrillation ([place]) 2. Chronic diastolic heart failure ([place]) 3. Bradycardia 4. Essential hypertension -Permanent A-fib.  With slow ventricular response at times.  Monitor last year showed his heart rate can increase to 101 bpm.  Nursing staff report he is more  fatigued but daughter reports he is doing well.  Pulse in the 40s today.  He describes no dizziness or lightheadedness.  There is no syncope.  We again discussed that we will avoid AV nodal agents.  There is no strong indication for pacing.  Should he have a change in his symptomology we can pursue a repeat monitor.  Clearly if he has falls or syncope we want to know about this.  Daughter was in agreement for a conservative approach.  This is the patient's request.  He is not on anticoagulation after shared decision due to falls and prior subdural hematoma.  His volume status is acceptable on current medications.  We will arrange for him to see an APP in 6 months.  He will see me yearly.  I also discussed that his chart from the facility has a different patient's medication list.  She will check with the facility to ensure that the patient is not receiving medications that are not prescribed to him.  1 of these medications was carvedilol which could clearly worsen his bradycardia.   Disposition: Return in about 6 months (around 11/05/2022).  Medication Adjustments/Labs and Tests Ordered: Current medicines are reviewed at length with the patient today.  Concerns regarding medicines are outlined above.  No orders of the defined types were placed in this encounter.  No orders of the defined types were placed in this encounter.   Patient Instructions  Medication Instructions:  The current medical regimen is effective;   continue present plan and medications.  *If you need a refill on your cardiac medications before your next appointment, please call your pharmacy*   Follow-Up: At [facility], you and your health needs are our priority.  As part of our continuing mission to provide you with exceptional heart care, we have created designated Provider Care Teams.  These Care Teams include your primary Cardiologist (physician) and Advanced Practice Providers (APPs -  Physician Assistants and Nurse Practitioners) who all work together to provide you with the care you need, when you need it.  We recommend signing up for the patient portal called "MyChart".  Sign up information is provided on this After Visit Summary.  MyChart is used to connect with patients for Virtual Visits (Telemedicine).  Patients are able to view lab/test results, encounter notes, upcoming appointments, etc.  Non-urgent messages can be sent to your provider as well.   To learn more about what you can do with MyChart, go to [email or web address].    Your next appointment:   6 month(s)  Provider:   Sande Rives, PA-C, Almyra Deforest, PA-C, or Diona Browner, NP    Then, Evalina Field, MD will plan to see you again in 12 month(s).       Time Spent with Patient: I have spent a total of 25 minutes with patient reviewing hospital notes, telemetry, EKGs, labs and examining the patient as well as establishing an assessment and plan that was discussed with the patient.  > 50% of time was spent in direct patient care.  Signed, Addison Naegeli. Audie Box, MD, [place]  [address], [place], [place] [address] [phone]  05/05/2022 12:01 PM

## 2022-05-05 ENCOUNTER — Encounter: Payer: Self-pay | Admitting: Cardiovascular Disease

## 2022-05-05 ENCOUNTER — Ambulatory Visit: Payer: Medicare Other | Attending: Cardiovascular Disease | Admitting: Cardiovascular Disease

## 2022-05-05 VITALS — BP 122/50 | HR 45 | Ht 73.0 in | Wt 193.0 lb

## 2022-05-05 DIAGNOSIS — I5032 Chronic diastolic (congestive) heart failure: Secondary | ICD-10-CM

## 2022-05-05 DIAGNOSIS — R001 Bradycardia, unspecified: Secondary | ICD-10-CM | POA: Diagnosis not present

## 2022-05-05 DIAGNOSIS — I4811 Longstanding persistent atrial fibrillation: Secondary | ICD-10-CM | POA: Diagnosis not present

## 2022-05-05 DIAGNOSIS — I10 Essential (primary) hypertension: Secondary | ICD-10-CM | POA: Diagnosis not present

## 2022-05-05 NOTE — Patient Instructions (Signed)
Medication Instructions:  The current medical regimen is effective;  continue present plan and medications.  *If you need a refill on your cardiac medications before your next appointment, please call your pharmacy*  Follow-Up: At [facility], you and your health needs are our priority.  As part of our continuing mission to provide you with exceptional heart care, we have created designated Provider Care Teams.  These Care Teams include your primary Cardiologist (physician) and Advanced Practice Providers (APPs -  Physician Assistants and Nurse Practitioners) who all work together to provide you with the care you need, when you need it.  We recommend signing up for the patient portal called "MyChart".  Sign up information is provided on this After Visit Summary.  MyChart is used to connect with patients for Virtual Visits (Telemedicine).  Patients are able to view lab/test results, encounter notes, upcoming appointments, etc.  Non-urgent messages can be sent to your provider as well.   To learn more about what you can do with MyChart, go to [email or web address].    Your next appointment:   6 month(s)  Provider:   Callie Goodrich, PA-C, Hao Meng, PA-C, or Emily Monge, NP    Then, Marenisco T O'Neal, MD will plan to see you again in 12 month(s).    

## 2023-01-01 IMAGING — CT CT HEAD W/O CM
4 series · 15 of 47 positions shown, 17 images · non-contrast
Comparison: 01/06/2019

CLINICAL DATA: Facial trauma. Unwitnessed fall. Laceration of the
posterior head. History of subdural hematoma in the past.



[Series 2: head wo · axial · 0.45mm/px · z∈[-179,-59]mm · 7 of 32 slices shown, 9 images]
[im 4/32  brain]
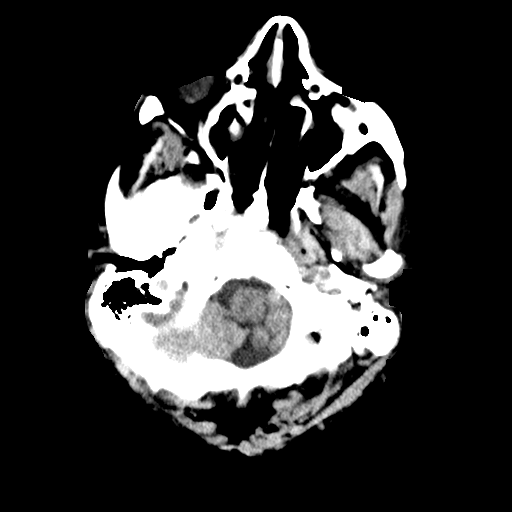
[im 4/32  bone]
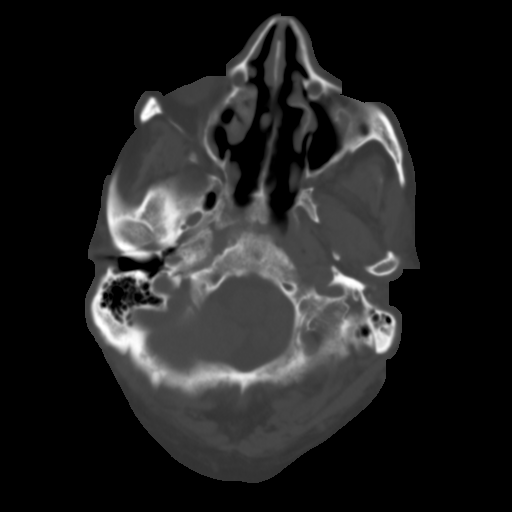
[im 8/32  brain]
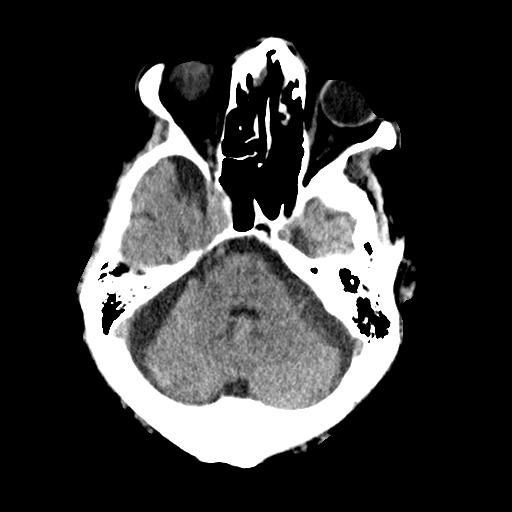
[im 12/32  brain]
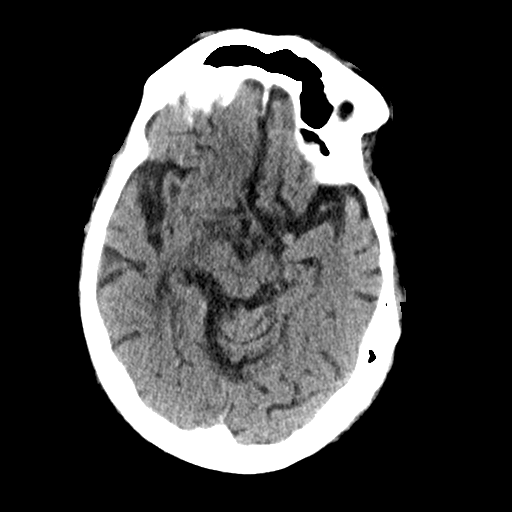
[im 16/32  brain]
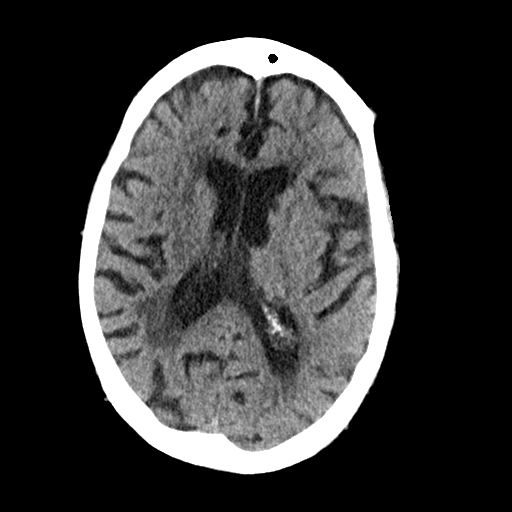
[im 20/32  brain]
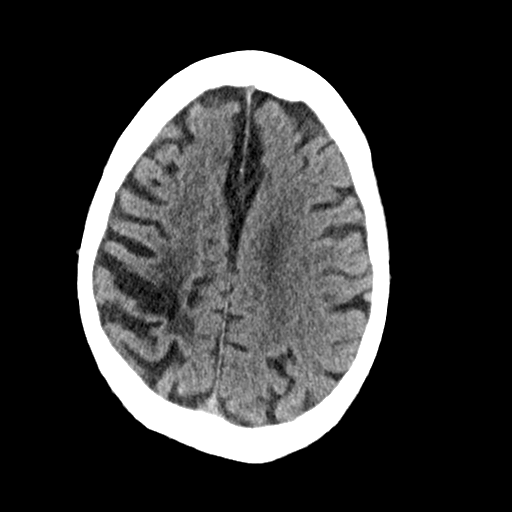
[im 20/32  bone]
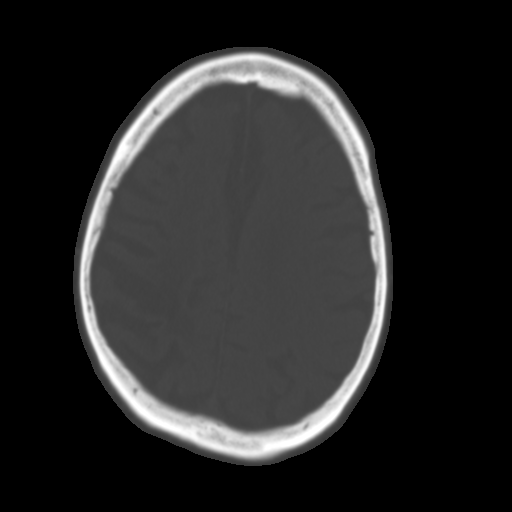
[im 24/32  brain]
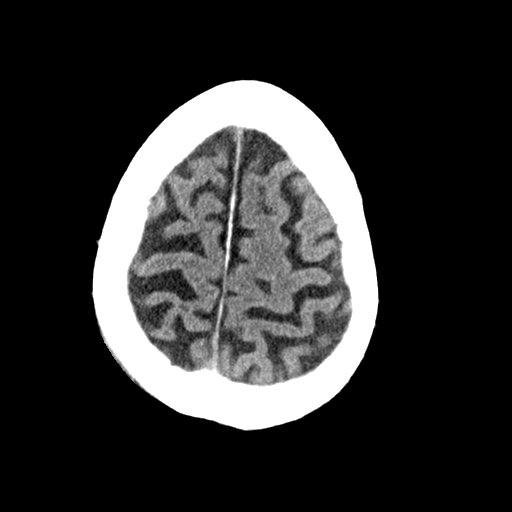
[im 28/32  brain]
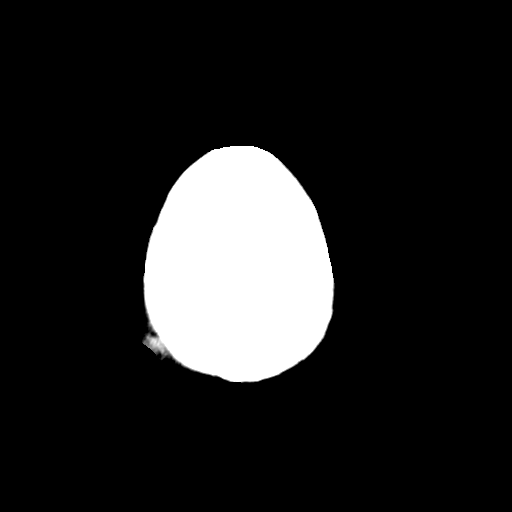

[Series 3: head bone · axial · 0.45mm/px · z∈[-180,-164]mm · 2 of 80 slices shown]
[im 8/80  bone]
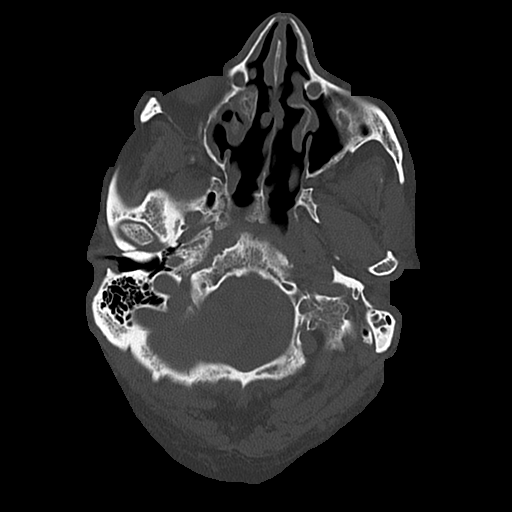
[im 16/80  bone]
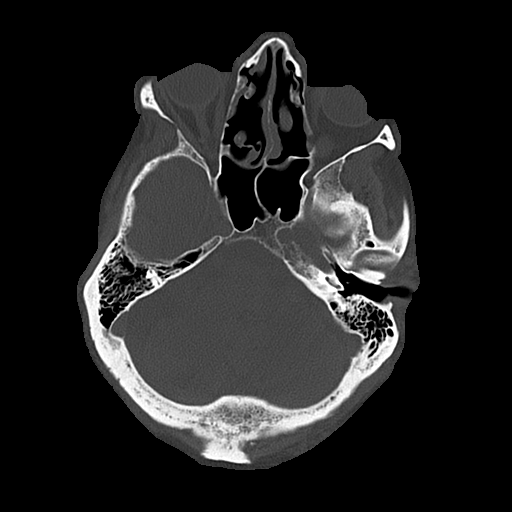

[Series 4: coronal soft · coronal · 0.34mm/px · 3 of 73 slices shown]
[im 25/73  brain]
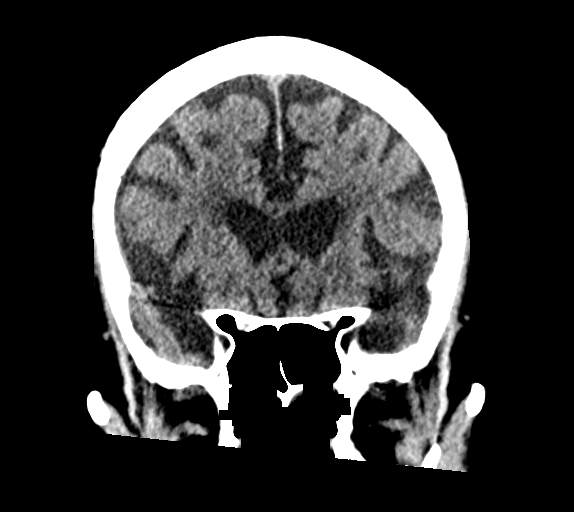
[im 33/73  brain]
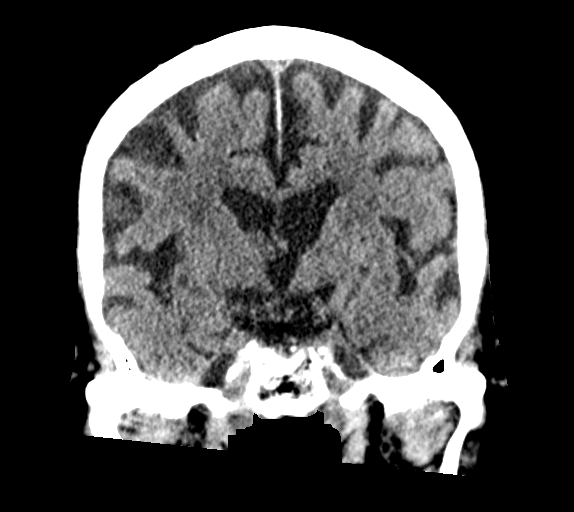
[im 41/73  brain]
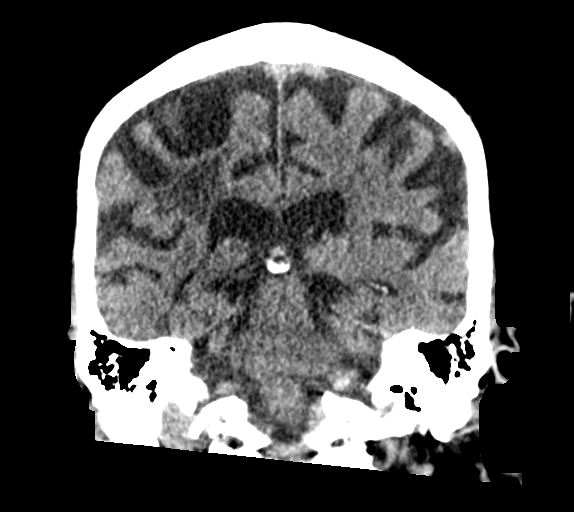

[Series 5: sagittal soft · sagittal · 0.34mm/px · 3 of 61 slices shown]
[im 21/61  brain]
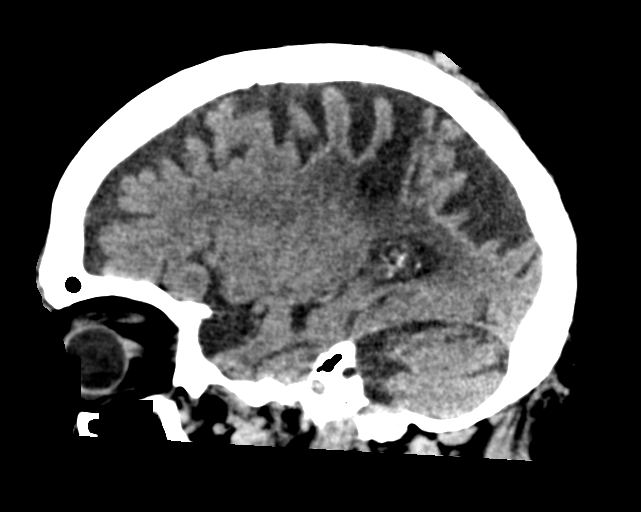
[im 31/61  brain]
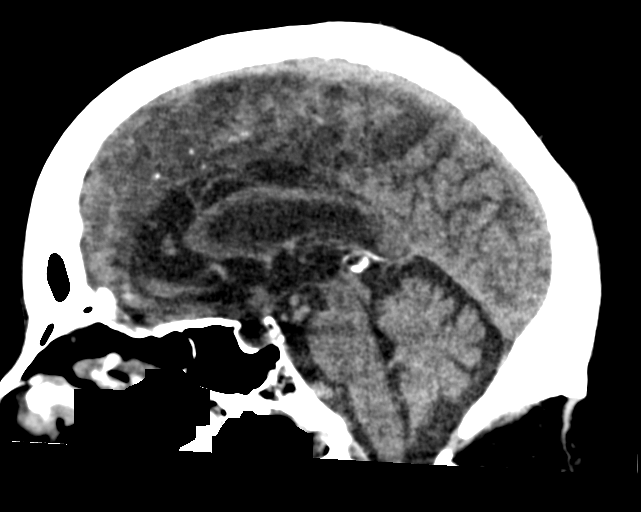
[im 41/61  brain]
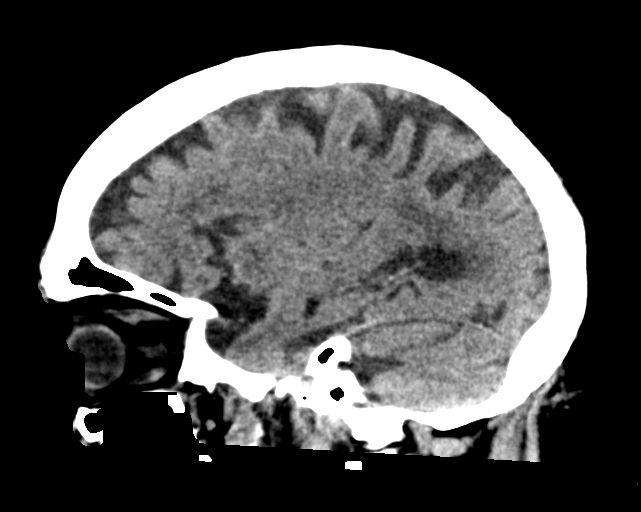

[15 of 47 positions shown; findings below may reference images not displayed]

FINDINGS: CT HEAD FINDINGS

Brain: There is significant central and cortical atrophy.
Periventricular white matter change is consistent with small vessel
disease. There is no intra or extra-axial fluid collection or mass
lesion. The basilar cisterns and ventricles have a normal
appearance. There is no CT evidence for acute infarction or
hemorrhage.

Vascular: No hyperdense vessel or unexpected calcification.

Skull: Normal. Negative for fracture or focal lesion.

Sinuses/Orbits: Orbits are unremarkable. There is mucosal thickening
of the paranasal sinuses. Remote maxillary sinus surgery.

Other: Moderate hematoma in the RIGHT posterior parietal scalp at
the vertex. No underlying fracture.

CT CERVICAL SPINE FINDINGS

Alignment: Normal.

Skull base and vertebrae: No acute fracture. No primary bone lesion
or focal pathologic process.

Soft tissues and spinal canal: Partially imaged 2.2 centimeter
nodule in the posterior RIGHT thyroid lobe.

Disc levels: Moderate disc height loss and associated uncovertebral
spurring in the mid cervical levels.

Upper chest: Negative.

Other: None
IMPRESSION: 1.  No evidence for acute intracranial abnormality.
2. Significant periventricular white matter changes and atrophy.
3. Moderate scalp hematoma in the RIGHT posterior parietal region.
4. No evidence for acute cervical spine abnormality. Moderate mid
cervical degenerative changes.
5. Partially imaged 2.2 centimeter RIGHT thyroid nodule. In the
setting of significant comorbidities or limited life expectancy, no
follow-up recommended (ref: [HOSPITAL]. [DATE]):

## 2023-04-14 IMAGING — CT CT CHEST W/ CM
2 of 3 series · 14 of 36 positions shown, 17 images · IV contrast (agent unspecified)
Comparison: Chest x-ray earlier today.

CLINICAL DATA: Weakness, cough and malaise with suspected
pneumonia.

EXAM:
CT CHEST WITH CONTRAST
TECHNIQUE: Multidetector CT imaging of the chest was performed during
intravenous contrast administration.

[Series 3: thorax 2.0 i31f 2 · axial · 0.72mm/px · z∈[+1305,+1557]mm · 11 of 148 slices shown, 14 images]
[im 11/148  mediastinal]
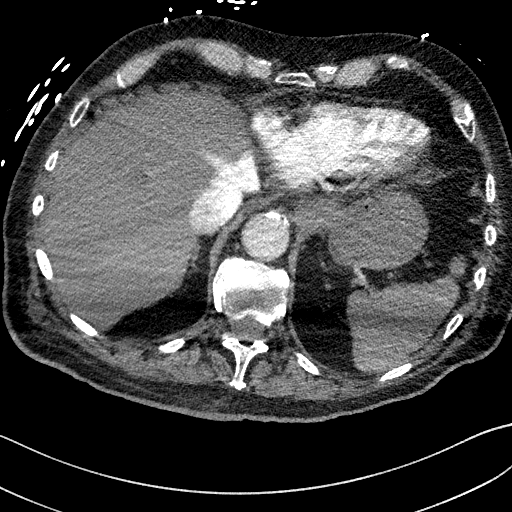
[im 11/148  lung]
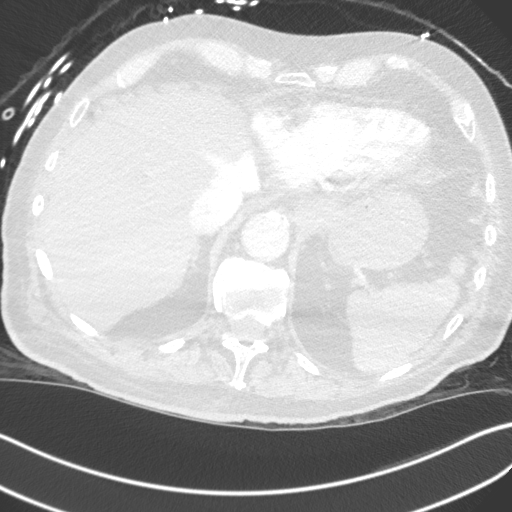
[im 22/148  lung]
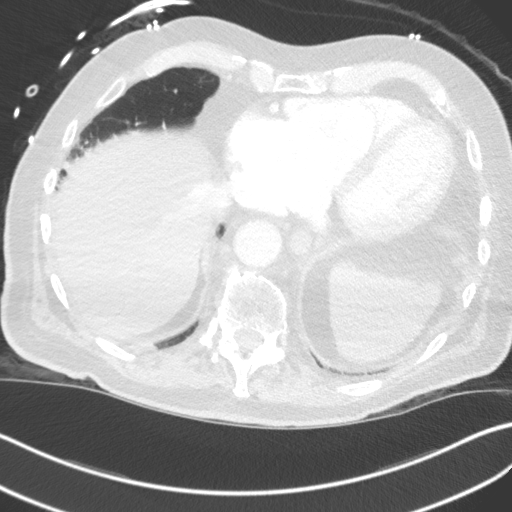
[im 33/148  lung]
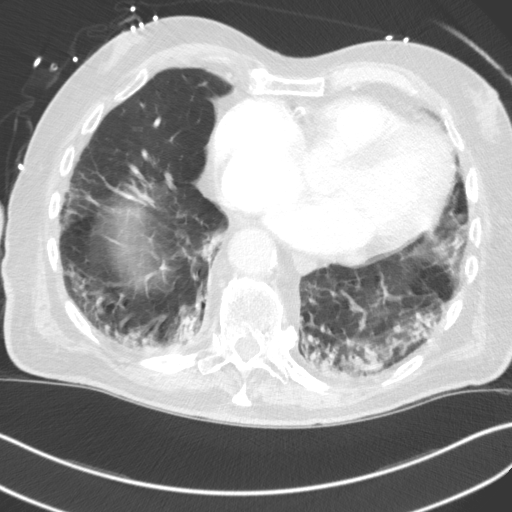
[im 50/148  lung]
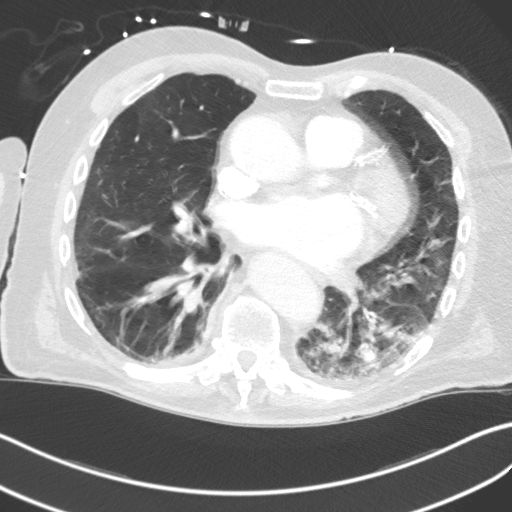
[im 60/148  mediastinal]
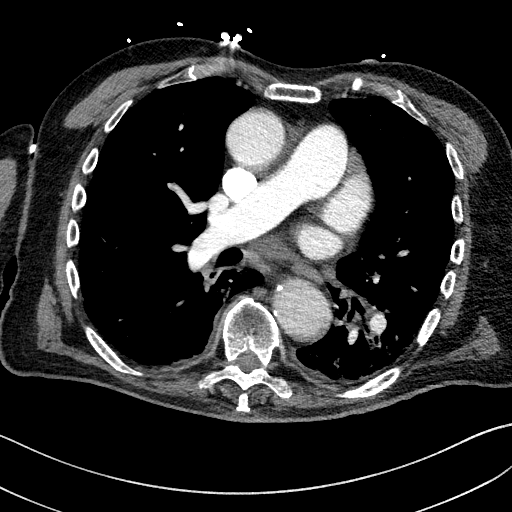
[im 60/148  lung]
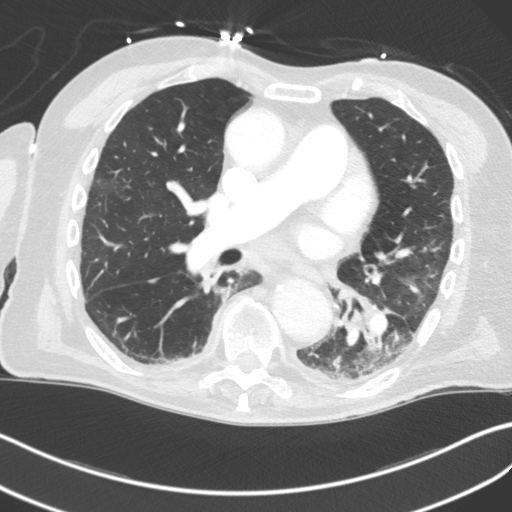
[im 77/148  lung]
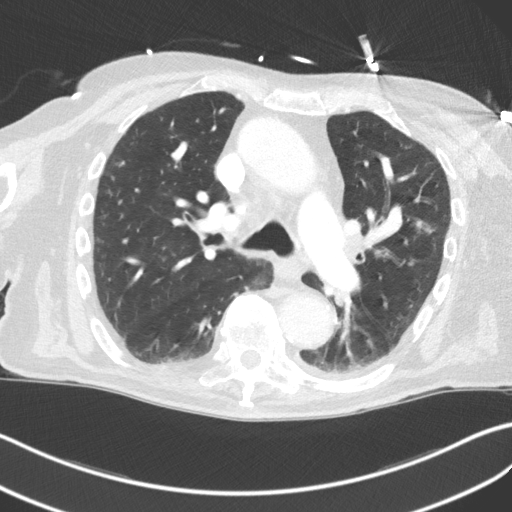
[im 88/148  lung]
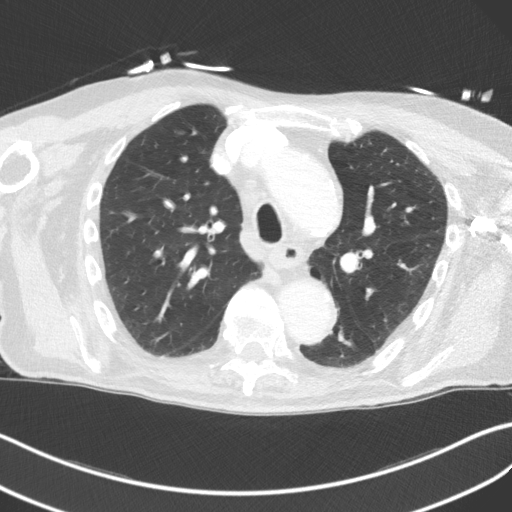
[im 99/148  lung]
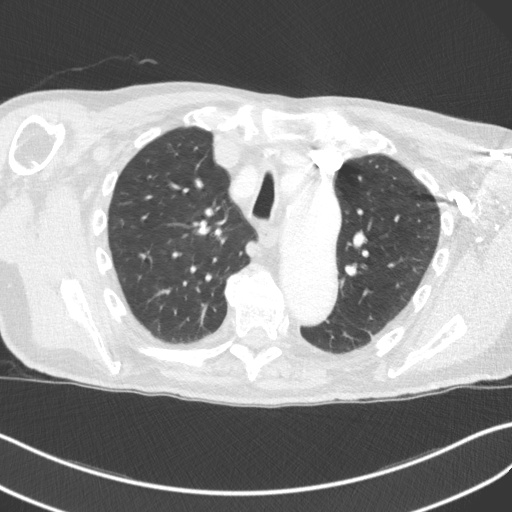
[im 115/148  mediastinal]
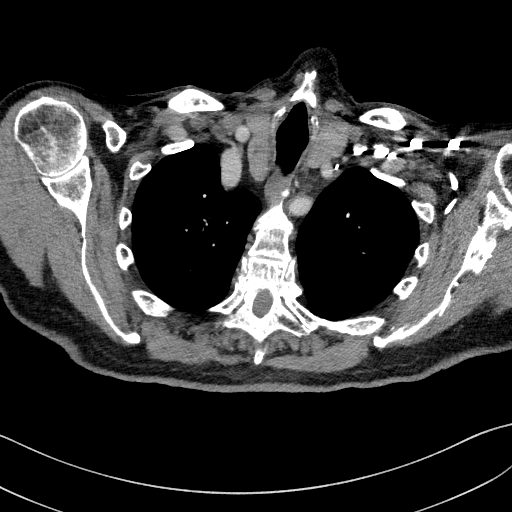
[im 115/148  lung]
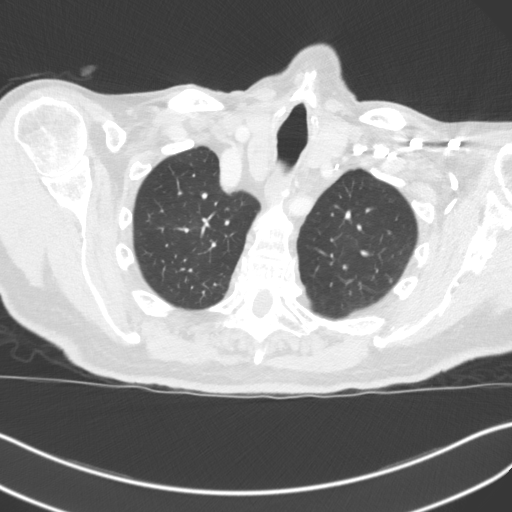
[im 126/148  lung]
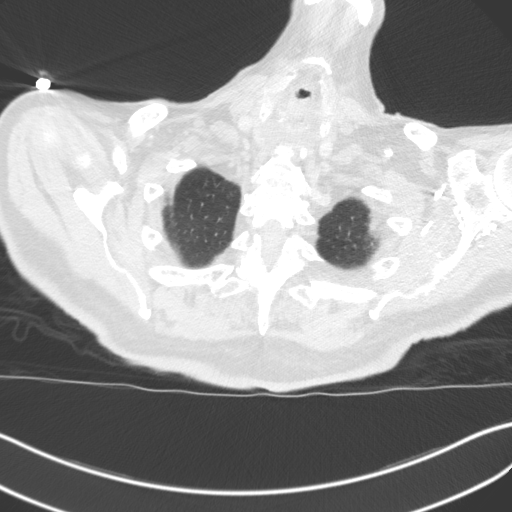
[im 137/148  lung]
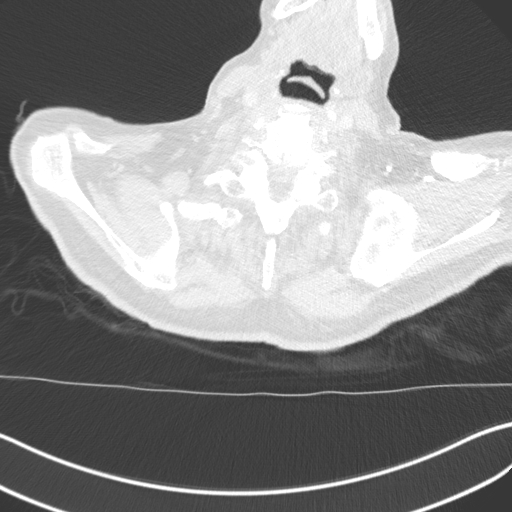

[Series 5: coronal · coronal · 0.59mm/px · 3 of 151 slices shown]
[im 31/151  lung]
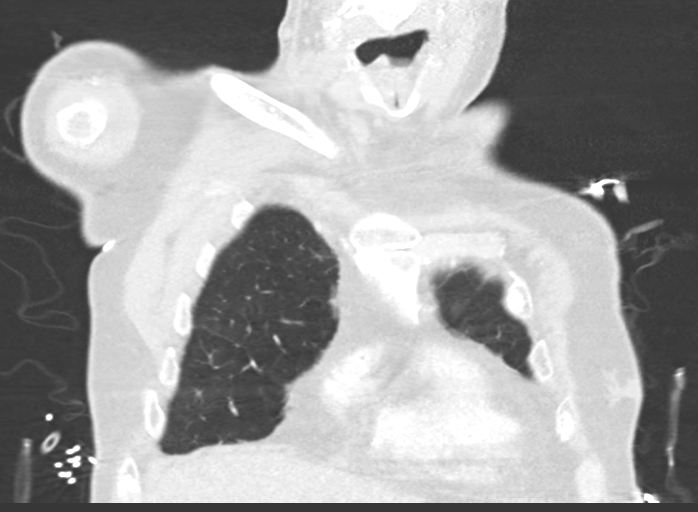
[im 61/151  lung]
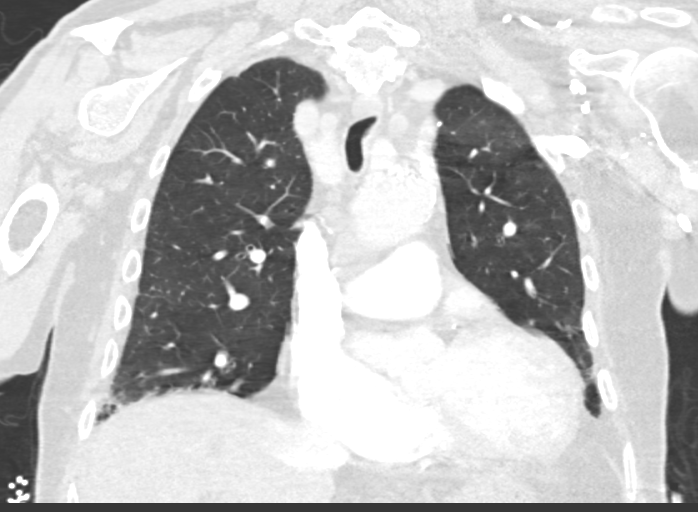
[im 91/151  lung]
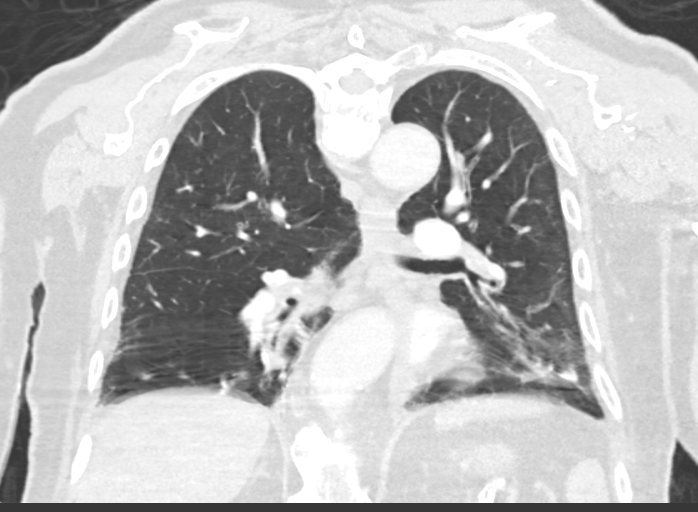

[14 of 36 positions shown; findings below may reference images not displayed]

RADIATION DOSE REDUCTION: This exam was performed according to the
departmental dose-optimization program which includes automated
exposure control, adjustment of the mA and/or kV according to
patient size and/or use of iterative reconstruction technique.

CONTRAST:  75mL OMNIPAQUE IOHEXOL 350 MG/ML SOLN
FINDINGS: Cardiovascular: Atherosclerosis and aneurysmal disease of the
thoracic aorta. The ascending thoracic aorta measures up to 4.1 cm,
the proximal arch measures 4 cm, the distal arch measures 3.8 cm and
the descending thoracic aorta measures up to 3.8 cm. The heart is
mild-to-moderately enlarged. Coronary atherosclerosis present with
calcified plaque in a 3 vessel distribution. No pericardial fluid
identified. Central pulmonary arteries are dilated with the main
pulmonary artery measuring up to 3.8 cm. Reflux of contrast into the
IVC and hepatic veins is consistent with some degree of right heart
failure which is also supported by right ventricular dilatation.

Mediastinum/Nodes: No enlarged mediastinal, hilar, or axillary lymph
nodes. Thyroid gland, trachea, and esophagus demonstrate no
significant findings.

Lungs/Pleura: There is some respiratory motion. Bronchial thickening
and some surrounding airspace opacity in the left

lower lobe is consistent with bronchitis and potentially additional
pneumonia. Underlying chronic lung disease suspected. No overt
edema, pleural fluid or pneumothorax.

Upper Abdomen: No acute abnormality.

Musculoskeletal: Osteopenia. No fractures or bony lesions
identified.
IMPRESSION: 1. Suspect bronchitis and pneumonia in the left lower lobe.
2. Atherosclerosis and diffuse aneurysmal disease of the thoracic
aorta. The ascending thoracic aorta measures up to 4.1 cm and the
arch measures up to 4 cm. Recommend semi-annual imaging followup by
CTA or MRA and referral to cardiothoracic surgery if not already
obtained. This recommendation follows 6898
ACCF/AHA/AATS/ACR/ASA/SCA/MOOLMAN/SELVIN ROLANDO/UCULMANA/SMIT Guidelines for the
Diagnosis and Management of Patients With Thoracic Aortic Disease.
Circulation. 6898; 121: E266-e36. Aortic aneurysm NOS (W34V8-607.C)
3. Dilated central pulmonary arteries are consistent with underlying
pulmonary hypertension and there also is evidence of right heart
failure.
4. Coronary atherosclerosis with calcified plaque in a 3 vessel
distribution.

Aortic aneurysm NOS (W34V8-607.C).

## 2023-08-06 ENCOUNTER — Emergency Department (HOSPITAL_COMMUNITY)

## 2023-08-06 ENCOUNTER — Encounter (HOSPITAL_COMMUNITY): Payer: Self-pay | Admitting: Family Medicine

## 2023-08-06 ENCOUNTER — Other Ambulatory Visit: Payer: Self-pay

## 2023-08-06 ENCOUNTER — Inpatient Hospital Stay (HOSPITAL_COMMUNITY)

## 2023-08-06 ENCOUNTER — Inpatient Hospital Stay (HOSPITAL_COMMUNITY)
Admission: EM | Admit: 2023-08-06 | Discharge: 2023-08-09 | DRG: 682 | Disposition: A | Discharge status: Other Acute Inpatient Hospital | Source: Skilled Nursing Facility | Attending: Family Medicine | Admitting: Family Medicine

## 2023-08-06 DIAGNOSIS — E861 Hypovolemia: Principal | ICD-10-CM | POA: Diagnosis present

## 2023-08-06 DIAGNOSIS — F0150 Vascular dementia without behavioral disturbance: Secondary | ICD-10-CM | POA: Diagnosis present

## 2023-08-06 DIAGNOSIS — B370 Candidal stomatitis: Secondary | ICD-10-CM | POA: Diagnosis present

## 2023-08-06 DIAGNOSIS — B349 Viral infection, unspecified: Secondary | ICD-10-CM | POA: Diagnosis present

## 2023-08-06 DIAGNOSIS — Z87891 Personal history of nicotine dependence: Secondary | ICD-10-CM | POA: Diagnosis not present

## 2023-08-06 DIAGNOSIS — R0902 Hypoxemia: Secondary | ICD-10-CM

## 2023-08-06 DIAGNOSIS — I9589 Other hypotension: Secondary | ICD-10-CM | POA: Diagnosis present

## 2023-08-06 DIAGNOSIS — Z789 Other specified health status: Secondary | ICD-10-CM

## 2023-08-06 DIAGNOSIS — D649 Anemia, unspecified: Secondary | ICD-10-CM | POA: Insufficient documentation

## 2023-08-06 DIAGNOSIS — R296 Repeated falls: Secondary | ICD-10-CM | POA: Diagnosis present

## 2023-08-06 DIAGNOSIS — E538 Deficiency of other specified B group vitamins: Secondary | ICD-10-CM | POA: Diagnosis present

## 2023-08-06 DIAGNOSIS — J9601 Acute respiratory failure with hypoxia: Secondary | ICD-10-CM | POA: Diagnosis present

## 2023-08-06 DIAGNOSIS — L22 Diaper dermatitis: Secondary | ICD-10-CM | POA: Diagnosis present

## 2023-08-06 DIAGNOSIS — E860 Dehydration: Secondary | ICD-10-CM | POA: Diagnosis present

## 2023-08-06 DIAGNOSIS — B379 Candidiasis, unspecified: Secondary | ICD-10-CM

## 2023-08-06 DIAGNOSIS — D509 Iron deficiency anemia, unspecified: Secondary | ICD-10-CM | POA: Diagnosis present

## 2023-08-06 DIAGNOSIS — N390 Urinary tract infection, site not specified: Secondary | ICD-10-CM | POA: Diagnosis present

## 2023-08-06 DIAGNOSIS — E039 Hypothyroidism, unspecified: Secondary | ICD-10-CM | POA: Diagnosis present

## 2023-08-06 DIAGNOSIS — R1310 Dysphagia, unspecified: Secondary | ICD-10-CM | POA: Diagnosis present

## 2023-08-06 DIAGNOSIS — N179 Acute kidney failure, unspecified: Secondary | ICD-10-CM | POA: Diagnosis present

## 2023-08-06 DIAGNOSIS — Z7982 Long term (current) use of aspirin: Secondary | ICD-10-CM | POA: Diagnosis not present

## 2023-08-06 DIAGNOSIS — I480 Paroxysmal atrial fibrillation: Secondary | ICD-10-CM | POA: Diagnosis present

## 2023-08-06 DIAGNOSIS — R5381 Other malaise: Secondary | ICD-10-CM | POA: Diagnosis not present

## 2023-08-06 DIAGNOSIS — E559 Vitamin D deficiency, unspecified: Secondary | ICD-10-CM | POA: Diagnosis present

## 2023-08-06 DIAGNOSIS — R0609 Other forms of dyspnea: Secondary | ICD-10-CM | POA: Diagnosis not present

## 2023-08-06 DIAGNOSIS — R0602 Shortness of breath: Secondary | ICD-10-CM

## 2023-08-06 DIAGNOSIS — I10 Essential (primary) hypertension: Secondary | ICD-10-CM | POA: Diagnosis present

## 2023-08-06 DIAGNOSIS — Z79899 Other long term (current) drug therapy: Secondary | ICD-10-CM

## 2023-08-06 DIAGNOSIS — D696 Thrombocytopenia, unspecified: Secondary | ICD-10-CM | POA: Diagnosis present

## 2023-08-06 DIAGNOSIS — R8290 Unspecified abnormal findings in urine: Secondary | ICD-10-CM | POA: Insufficient documentation

## 2023-08-06 DIAGNOSIS — Z66 Do not resuscitate: Secondary | ICD-10-CM | POA: Diagnosis present

## 2023-08-06 LAB — CBC WITH DIFFERENTIAL/PLATELET
Abs Immature Granulocytes: 0.04 10*3/uL (ref 0.00–0.07)
Basophils Absolute: 0 10*3/uL (ref 0.0–0.1)
Basophils Relative: 1 %
Eosinophils Absolute: 0.1 10*3/uL (ref 0.0–0.5)
Eosinophils Relative: 1 %
HCT: 36.3 % — ABNORMAL LOW (ref 39.0–52.0)
Hemoglobin: 11.3 g/dL — ABNORMAL LOW (ref 13.0–17.0)
Immature Granulocytes: 1 %
Lymphocytes Relative: 16 %
Lymphs Abs: 1.3 10*3/uL (ref 0.7–4.0)
MCH: 31 pg (ref 26.0–34.0)
MCHC: 31.1 g/dL (ref 30.0–36.0)
MCV: 99.7 fL (ref 80.0–100.0)
Monocytes Absolute: 0.4 10*3/uL (ref 0.1–1.0)
Monocytes Relative: 5 %
Neutro Abs: 6.3 10*3/uL (ref 1.7–7.7)
Neutrophils Relative %: 76 %
Platelets: 144 10*3/uL — ABNORMAL LOW (ref 150–400)
RBC: 3.64 MIL/uL — ABNORMAL LOW (ref 4.22–5.81)
RDW: 12.8 % (ref 11.5–15.5)
WBC: 8.2 10*3/uL (ref 4.0–10.5)
nRBC: 0 % (ref 0.0–0.2)

## 2023-08-06 LAB — COMPREHENSIVE METABOLIC PANEL WITH GFR
ALT: 11 U/L (ref 0–44)
AST: 17 U/L (ref 15–41)
Albumin: 2.4 g/dL — ABNORMAL LOW (ref 3.5–5.0)
Alkaline Phosphatase: 51 U/L (ref 38–126)
Anion gap: 14 (ref 5–15)
BUN: 41 mg/dL — ABNORMAL HIGH (ref 8–23)
CO2: 21 mmol/L — ABNORMAL LOW (ref 22–32)
Calcium: 9.9 mg/dL (ref 8.9–10.3)
Chloride: 107 mmol/L (ref 98–111)
Creatinine, Ser: 1.97 mg/dL — ABNORMAL HIGH (ref 0.61–1.24)
GFR, Estimated: 31 mL/min — ABNORMAL LOW (ref >60)
Glucose, Bld: 149 mg/dL — ABNORMAL HIGH (ref 70–99)
Potassium: 4.3 mmol/L (ref 3.5–5.1)
Sodium: 142 mmol/L (ref 135–145)
Total Bilirubin: 0.4 mg/dL (ref 0.0–1.2)
Total Protein: 6.8 g/dL (ref 6.5–8.1)

## 2023-08-06 LAB — TROPONIN I (HIGH SENSITIVITY)
Troponin I (High Sensitivity): 25 ng/L — ABNORMAL HIGH (ref <18)
Troponin I (High Sensitivity): 22 ng/L — ABNORMAL HIGH (ref <18)

## 2023-08-06 LAB — TSH: TSH: 0.21 u[IU]/mL — ABNORMAL LOW (ref 0.350–4.500)

## 2023-08-06 LAB — RESP PANEL BY RT-PCR (RSV, FLU A&B, COVID)  RVPGX2
Influenza A by PCR: NEGATIVE
Influenza B by PCR: NEGATIVE
Resp Syncytial Virus by PCR: NEGATIVE
SARS Coronavirus 2 by RT PCR: NEGATIVE

## 2023-08-06 MED ORDER — ENOXAPARIN SODIUM 30 MG/0.3ML IJ SOSY
30.0000 mg | PREFILLED_SYRINGE | INTRAMUSCULAR | Status: DC
  Administered 2023-08-06 – 2023-08-08 (×3): 30 mg via SUBCUTANEOUS
  Filled 2023-08-06 (×3): qty 0.3

## 2023-08-06 MED ORDER — ACETAMINOPHEN 325 MG PO TABS
650.0000 mg | ORAL_TABLET | Freq: Four times a day (QID) | ORAL | Status: DC | PRN
Start: 2023-08-06 — End: 2023-08-10

## 2023-08-06 MED ORDER — TRAZODONE HCL 50 MG PO TABS
25.0000 mg | ORAL_TABLET | Freq: Every day | ORAL | Status: DC
  Administered 2023-08-07 – 2023-08-08 (×2): 25 mg via ORAL
  Filled 2023-08-06 (×2): qty 1

## 2023-08-06 MED ORDER — NYSTATIN 100000 UNIT/ML MT SUSP
5.0000 mL | Freq: Four times a day (QID) | OROMUCOSAL | Status: DC
  Administered 2023-08-07 – 2023-08-09 (×10): 500000 [IU] via ORAL
  Filled 2023-08-06 (×14): qty 5

## 2023-08-06 MED ORDER — ACETAMINOPHEN 650 MG RE SUPP: 650.0000 mg | Freq: Four times a day (QID) | RECTAL | Status: DC | PRN

## 2023-08-06 MED ORDER — NYSTATIN 100000 UNIT/GM EX POWD
1.0000 | Freq: Two times a day (BID) | CUTANEOUS | Status: DC
  Administered 2023-08-06 – 2023-08-09 (×6): 1 via TOPICAL
  Filled 2023-08-06 (×2): qty 15

## 2023-08-06 MED ORDER — LACTATED RINGERS IV SOLN: INTRAVENOUS | Status: AC

## 2023-08-06 NOTE — ED Provider Notes (Signed)
 [place] EMERGENCY DEPARTMENT AT [facility] Provider Note   CSN: [id number] Arrival date & time: 08/06/23  1051     Patient presents with: Shortness of Breath   Roger Briggs is a 89 y.o. male.   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 When the fire department arrived patient was hypoxic and hypotensive.  Pressure was 80/64 and he received 500 mL of fluid with improvement of blood pressure.  O2 sats were 87% on room air and he was placed on a nonrebreather.  Upon arrival to the ER sats are 100%.  Patient does not typically wear oxygen. His daughter is present and reports that over the weekend he was refusing to eat and drink.  He does have some difficulty swallowing and works with speech therapy at the place where he lives but this weekend they could not get anything down him.  This morning his speech therapist did get him to eat breakfast but he has been much weaker and yesterday slept all day and did not get out of bed at all.  Usually he gets around with a walker.  Daughter is not aware of a fever, vomiting or diarrhea.  Patient denies chest pain or abdominal pain.  She also thought that his speech was very difficult to understand yesterday and today which is unusual.  The history is provided by the patient, the EMS personnel, a relative and medical records.  Shortness of Breath      Prior to Admission medications   Medication Sig Start Date End Date Taking? Authorizing Provider  ASPIRIN 81 PO Take 81 mg by mouth daily.    [provider]  atorvastatin (LIPITOR) 40 MG tablet Take 40 mg by mouth daily.    [provider]  betamethasone valerate lotion (VALISONE) 0.1 % Apply 1 application  topically 2 (two) times daily as needed for irritation (to affected  areas).    [provider]  carvedilol (COREG CR) 10 MG 24 hr capsule Take 10 mg by mouth daily.    [provider]  Cholecalciferol (VITAMIN D3) 50 MCG (2000 UT) capsule Take 2,000 Units by mouth daily.    [provider]  Emollient (CERAVE DAILY MOISTURIZING) LOTN Apply 1 Application topically See admin instructions. Apply to affected areas 2 times a day and twice a day to affected areas of the face, as needed for irritation 08/21/19   [provider]  ferrous sulfate 325 (65 FE) MG tablet Take 325 mg by mouth See admin instructions. Take 325 mg by mouth with food on Mon/Wed/Fri mornings    [provider]  furosemide  (LASIX ) 20 MG tablet Take 1 tablet (20 mg total) by mouth daily. Patient taking differently: Take 20 mg by mouth every other day. 05/06/20 05/05/22  O'NealDarryle Ned, MD  gabapentin (NEURONTIN) 300 MG capsule Take 300 mg by mouth 2 (two) times daily.    [provider]  hydrALAZINE  (APRESOLINE ) 10 MG tablet Take 10 mg by mouth 3 (three) times daily.    [provider]  lisinopril  (PRINIVIL ,ZESTRIL ) 40 MG tablet Take 40 mg by mouth daily.    [provider]  loratadine  (CLARITIN ) 10 MG tablet Take 10 mg by mouth daily. 08/06/19   [provider]  losartan (COZAAR) 25 MG tablet Take 25 mg by mouth  daily.    [provider]  TAPAZOLE  5 MG tablet Take 5 mg by mouth in the morning.    [provider]  traZODone  (DESYREL ) 50 MG tablet Take 25 mg by mouth at bedtime.     [provider]  vitamin B-12 (CYANOCOBALAMIN) 1000 MCG tablet Take 1,000 mcg by mouth daily. 10/23/20   [provider]    Allergies: Patient has no known allergies.    Review of Systems  Respiratory:  Positive for shortness of breath.     Updated Vital Signs BP 101/62   Pulse (!) 54   Temp 97.6 F (36.4 C)   Resp (!) 21   Ht 6' 1 (1.854 m)   Wt 82.6 kg   SpO2 100%   BMI 24.01 kg/m    Physical Exam Vitals and nursing note reviewed.  Constitutional:      General: He is not in acute distress.    Appearance: He is well-developed.  HENT:     Head: Normocephalic and atraumatic.     Mouth/Throat:     Mouth: Mucous membranes are dry.   Eyes:     Conjunctiva/sclera: Conjunctivae normal.     Pupils: Pupils are equal, round, and reactive to light.    Cardiovascular:     Rate and Rhythm: Regular rhythm. Bradycardia present.     Heart sounds: No murmur heard. Pulmonary:     Effort: Pulmonary effort is normal. No respiratory distress.     Breath sounds: Examination of the right-lower field reveals decreased breath sounds. Examination of the left-lower field reveals decreased breath sounds. Decreased breath sounds present. No wheezing or rales.  Abdominal:     General: There is no distension.     Palpations: Abdomen is soft.     Tenderness: There is no abdominal tenderness. There is no guarding or rebound.   Musculoskeletal:        General: No tenderness. Normal range of motion.     Cervical back: Normal range of motion and neck supple.     Right lower leg: No tenderness. No edema.     Left lower leg: No tenderness. No edema.   Skin:    General: Skin is warm and dry.     Coloration: Skin is pale.     Findings: No erythema or rash.   Neurological:     Mental Status: He is alert and oriented to person, place, and time.   Psychiatric:        Behavior: Behavior normal.     (all labs ordered are listed, but only abnormal results are displayed) Labs Reviewed  CBC WITH DIFFERENTIAL/PLATELET - Abnormal; Notable for the following components:      Result Value   RBC 3.64 (*)    Hemoglobin 11.3 (*)    HCT 36.3 (*)    Platelets 144 (*)    All other components within normal limits  COMPREHENSIVE METABOLIC PANEL WITH GFR - Abnormal; Notable for the following components:   CO2 21 (*)    Glucose, Bld 149 (*)    BUN 41 (*)    Creatinine, Ser 1.97 (*)    Albumin 2.4  (*)    GFR, Estimated 31 (*)    All other components within normal limits  TROPONIN I (HIGH SENSITIVITY) - Abnormal; Notable for the following components:   Troponin I (High Sensitivity) 25 (*)    All other components within normal limits  TROPONIN I (HIGH SENSITIVITY) - Abnormal; Notable for the following components:  Troponin I (High Sensitivity) 22 (*)    All other components within normal limits  RESP PANEL BY RT-PCR (RSV, FLU A&B, COVID)  [id number]    EKG: EKG Interpretation Date/Time:  Monday August 06 2023 11:00:22 EDT Ventricular Rate:  59 PR Interval:    QRS Duration:  132 QT Interval:  436 QTC Calculation: 432 R Axis:   3  Text Interpretation: Atrial fibrillation Ventricular premature complex Nonspecific intraventricular conduction delay No significant change since last tracing Confirmed by Doretha Folks ([id number]) on 08/06/2023 11:13:43 AM  Radiology: CT Head Wo Contrast Result Date: 08/06/2023 CLINICAL DATA:  Neuro deficit, acute, stroke suspected.  Weakness. EXAM: CT HEAD WITHOUT CONTRAST TECHNIQUE: Contiguous axial images were obtained from the base of the skull through the vertex without intravenous contrast. RADIATION DOSE REDUCTION: This exam was performed according to the departmental dose-optimization program which includes automated exposure control, adjustment of the mA and/or kV according to patient size and/or use of iterative reconstruction technique. COMPARISON:  Head CT 09/19/2021 FINDINGS: Brain: There is no evidence of an acute infarct, intracranial hemorrhage, mass, midline shift, or extra-axial fluid collection. A moderate-sized chronic right frontoparietal infarct is unchanged. Patchy hypodensities elsewhere in the cerebral white matter bilaterally are unchanged and nonspecific but compatible with mild-to-moderate chronic small vessel ischemic disease. Chronic lacunar infarcts are again noted in the basal ganglia bilaterally and in the right cerebellar hemisphere.  There is mild-to-moderate cerebral atrophy. Vascular: Aspect atherosclerosis at the skull base. No hyperdense vessel. Skull: No acute fracture or suspicious lesion. Sinuses/Orbits: Prior functional endoscopic sinus surgery with scattered small nodular foci of soft tissue again noted in the nasal cavity potentially reflecting polyps. New large bilateral frontal sinus fluid levels. Clear mastoid air cells. Bilateral cataract extraction. Other: None. IMPRESSION: 1. No evidence of acute intracranial abnormality. 2. Chronic ischemia including an old right MCA infarct. 3. New bilateral frontal sinus fluid levels. Correlate for acute sinusitis. Electronically Signed   By: Dasie Hamburg M.D.   On: 08/06/2023 13:40   DG Chest Port 1 View Result Date: 08/06/2023 CLINICAL DATA:  Shortness of breath. EXAM: PORTABLE CHEST 1 VIEW COMPARISON:  09/19/2021. FINDINGS: Low lung volume. Bilateral lung fields are clear. Bilateral costophrenic angles are clear. Stable cardio-mediastinal silhouette. No acute osseous abnormalities. The soft tissues are within normal limits. IMPRESSION: No active disease. Electronically Signed   By: Ree Molt M.D.   On: 08/06/2023 11:33     Procedures   Medications Ordered in the ED  lactated ringers infusion ( Intravenous New Bag/Given 08/06/23 1126)                                    Medical Decision Making Amount and/or Complexity of Data Reviewed Independent Historian: EMS External Data Reviewed: notes. Labs: ordered. Decision-making details documented in ED Course. Radiology: ordered and independent interpretation performed. Decision-making details documented in ED Course. ECG/medicine tests: ordered and independent interpretation performed. Decision-making details documented in ED Course.  Risk Prescription drug management. Decision regarding hospitalization.   Pt with multiple medical problems and comorbidities and presenting today with a complaint that caries a high  risk for morbidity and mortality.  Here today with shortness of breath, hypoxia and hypotension.  Concern for dysrhythmia, dehydration, AKI, electrolyte abnormality, ACS.  Low risk Wells criteria with no unilateral leg pain or swelling or recent immobilization with lower suspicion for PE at this time.  Also concern for possible pneumonia  or infectious etiology as well as stroke given daughter's history of difficult to understand speech. After the 500 mL bolus on repeat patient's blood pressure was still low and he was started on fluids.  Blood pressure has improved with IV fluids.  I independently interpreted patient's labs and EKG.  EKG with some artifact but no acute findings, troponin is most likely at baseline at 25 and 22, CBC with stable hemoglobin and normal white count, CMP with new AKI with creatinine of 1.97 with normal electrolytes and anion gap.  I have independently visualized and interpreted pt's images today. CXR without signs of PNA, CT head without acute bleed.  Patient does have findings for bilateral frontal sinus fluid levels concerning for an acute sinusitis.  No evidence of acute stroke at this time.  Given patient's hypotension, AKI possible new sinusitis will admit for hydration and further care.  Also will do MRI to ensure patient has not had stroke.  Discussed the findings with the patient and his daughter.  Feel that patient warrants admission.  Consulted unassigned medicine for admission.      Final diagnoses:  Hypotension due to hypovolemia  AKI (acute kidney injury) [facility])  Hypoxia  Dehydration    ED Discharge Orders     None          Doretha Folks, MD 08/06/23 1432

## 2023-08-06 NOTE — ED Notes (Signed)
 A hospital bed has ordered for the room.

## 2023-08-06 NOTE — Hospital Course (Signed)
 Roger Briggs is a 89 y.o.male with a history of HTN, hyperthyroidism, dysphagia, hx of chronic A-fib not on AC d/t subdural hematoma, dementia who was admitted to the [facility] Medicine Teaching Service at [facility] for hypoxia and hypotension. His hospital course is detailed below:  Acute hypoxic respiratory failure Initially short of breath with hypoxia requiring 2L [place] upon admission.  No indication of PNA on CXR, quickly weaned to RA and otherwise well-appearing.  Shared decision making with family opted for CT chest showed likely inflammatory changes. Opted against treatment given back to baseline and not requiring additional oxygen.  AKI  Dehydration Cr elevated to 1.72 on admission, baseline appears to be ~1.0-1.1. Improved with IV fluids during admission.  Physical Deconditioning Family concern for dysphagia, decreased appetite, and increased drowsiness from baseline.  Ruled out acute intracranial pathology with CT and MRI.  PT/OT/SLP followed during admission.  Mentation and alertness improved to baseline, was started on a dysphagia 3 diet.   Candida Infection Noted to have a severe diaper rash and thrush on exam.  Treated with nystatin suspension and powder while inpatient.  Treat thrush with Nystatin suspension till 6/30 and Nystatin powder until diaper rash has resolved.  UTI UA suggestive of UTI, no prior culture data available.  Sent for culture and started treatment with cefadroxil 500 mg BID for 7 days to complete on 7/1.   Other chronic conditions were medically managed with home medications and formulary alternatives as necessary (Hyperthyroidism, HTN, A-Fib, Vit D def, Vit B12 def, IDA)  PCP Follow-up Recommendations: Consider treatment of hyperthyroidism OP s/p family discussion, family declined treatment inpatient Concern for decreased appetite - family wanted to consider starting an antidepressant such as Mirtazapine to help augment appetite or other appetite stimulants.   Discontinued Lisinopril  20 mg, Loratadine  10 mg, and Lasix  20 mg daily prior to discharge.

## 2023-08-06 NOTE — Assessment & Plan Note (Deleted)
 Likely 2/2 to age vs dementia vs stroke (ruled out by CT and MRI). - Diet NPO, awaiting bedside swallow - PT/OT/SLP consult, appreciate recs - TOC consult for PCP needs - Fall precautions - Delirium precautions

## 2023-08-06 NOTE — Assessment & Plan Note (Deleted)
 Likely secondary to acute sinusitis vs viral etiology (RPP negative).  Unclear history from patient.  Not on oxygen at baseline, but required nonrebreather due to being hypoxic to 87% on room air.  Currently on [place] in NAD.  CXR revealed no sign for pneumonia or atelectasis. - Wean oxygen as tolerated

## 2023-08-06 NOTE — H&P (Signed)
 Hospital Admission History and Physical Service Pager: [phone]  Patient name: Roger Briggs Medical record number: [id number] Date of Birth: 1929/11/26 Age: 89 y.o. Gender: male  Primary Care Provider: Pcp, No Consultants: None Code Status: DNR/DNI which was confirmed with family Roger Briggs) Preferred Emergency Contact:  Contact Information     Name Relation Home Work [place] Daughter [phone]      Name Relation Home Work Mobile   [place] Daughter   [phone]   Chief Complaint: Shortness of breath  Assessment and Plan: Roger Briggs is a 89 y.o. male with PMH of HTN, hyperthyroidism, A-fib not on anticoagulation, subdural hematoma, chronic ischemic MCA infarcts, and dementia presenting with physical deconditioning and AKI. Differential for this patient's presentation of this includes:  AKI - Prerenal likely secondary to dehydration in the setting of possible viral illness/sinusitis and worsening dysphagia. Initially hypotensive which improved with fluid resuscitation.  Deconditioning - Possible secondary to age, history of MCA ischemia, and dementia.  Workup thus far has been negative for acute cause, other than acute sinusitis.  Shortness of breath Viral etiology - Possible with imaging revealing acute sinusitis.  Cardiac - Unlikely due to negative troponins, no hx of chest pain, and Echo from 2021 revealed good heart function with preserved EF, no RWMA, and mild concentric ventricular hypertrophy, mild MVR and trivial AVR.  Assessment & Plan AKI (acute kidney injury) (HCC) Creatinine elevated to 1.97, no clear baseline recently.  However in 2023 baseline ~ 1.0.  Likely prerenal in the setting of dehydration 2/2 decreased appetite possibly d/t dysphagia and ulcers noted at the back of his throat. - Admit to FMTS, attending Dr. Donzetta - Med-Surg, Vital signs per floor - VTE prophylaxis: Lovenox  - Continue LR mIVF - AM CBC/BMP/TSH Physical  deconditioning Dysphagia Likely 2/2 to age vs dementia vs stroke (ruled out by CT and MRI). - Diet NPO, awaiting bedside swallow - PT/OT/SLP consult, appreciate recs - TOC consult for PCP needs - Fall precautions - Delirium precautions Shortness of breath Likely secondary to acute sinusitis vs viral etiology (RPP negative).  Unclear history from patient.  Not on oxygen at baseline, but required nonrebreather due to being hypoxic to 87% on room air.  Currently on [place] in NAD.  CXR revealed no sign for pneumonia or atelectasis. - Wean oxygen as tolerated Chronic health problem Candida Infection: Thrush with exudate and diaper rash have been present for quite some time.  Will treat w/ Nystatin suspension and powder. Hyperthyroidism: TSH wnl 3 years ago, not on any chronic antithyroid drugs. Will recheck TSH. HTN: Hypotensive on arrive, normotensive at this time. Will consider restarting home Lisinopril  20 mg daily as indicated. A-fib: Currently in A-fib, not on AC d/t hx of subdural hematoma. Vitamin D deficiency: Takes vitamin D 3 2000 units daily Vitamin B12 deficiency: Takes vitamin B12 1000 mcg daily  FEN/GI: NPO until SLP eval VTE Prophylaxis: Lovenox   Disposition: Med-Surg  History of Present Illness:  Roger Briggs is a 89 y.o. male presenting with shortness of breath and was found hypoxic and hypotensive at [facility] house.  Patient received 500 mL bolus of fluids to improve his blood pressure.  Normally not on oxygen but was desaturating on room air so was placed on nonrebreather.  Upon arrival to the ED saturations were at 100%.  Daughter was present in the ED and provided history that over the weekend he was refusing to eat and drink.  Endorses some difficulty swallowing, has a dysphagia diet,  and works with speech therapy regularly.  Patient ate breakfast this morning, but it was more difficult and weaker than normal.  He has been reluctant to eat a lot since January.  Reportedly he  was also sleeping all day and could not get out of bed at all yesterday, which is unlike him since he is usually getting around with a walker.  Daughter denied any fever, chest pain, vomiting, diarrhea, or abdominal pain.  She endorses that his speech was very difficult to understand yesterday and today, which is unusual for him.  He did have a fall about 3 weeks ago, no loss of consciousness.  Baseline mentation seems to be that he is aware of himself in the present situation, but never is able to get dates right or do short-term recall.  Long-term recall seems appropriate.  Daughter believes that he has been declining physically more recently and has had a lack of interest in food, possibly related to his dementia.  Patient is able to answer all questions and follow all commands, without much issue.  In the ED, vital signs demonstrated hypotension, bradycardia, and mild tachypnea.  Patient was saturating at 100%.  Troponin 25 and 22 respectively.  CBC with no leukocytosis and hemoglobin of 11.3 .  CMP with normal electrolytes, elevated glucose, elevated creatinine to 1.97, and elevated BUN to 41.  CXR revealed no active disease.  CT head and MRI brain without contrast revealed no acute intracranial abnormality, chronic ischemia in R MCA, and new bilateral frontal sinus acute sinusitis.  Review Of Systems: Per HPI  Pertinent Past Medical History: HTN Hyperthyroidism Dementia A-fib not on anticoagulation d/t hx of subdural hematoma Remainder reviewed in history tab.   Pertinent Past Surgical History: AAA repair Remainder reviewed in history tab.   Pertinent Social History: Tobacco use: Cigars and pipes, daily when he was younger, but no longer smokes Alcohol use: None Other Substance use: None Lives at [facility] (ALF)  Pertinent Family History: Daughter: HTN, Depression Remainder reviewed in history tab.   Important Outpatient Medications: Tylenol  500 mg every 6 hours as  needed Nystatin powder Trazodone  50 mg at bedtime Lisinopril  20 mg daily Loratadine  10 mg daily Vitamin D3 2000 units daily Ferrous sulfate 325 mg on M/W/F Fluticasone 50 mcg/ACT 1 spray in both nostrils daily Lasix  20 mg daily Mag oxide 400 mg daily Menthol zinc oxide 1 application daily to buttocks and groin Vitamin B12 1000 mcg daily Ensure 237 mL by mouth in the morning, noon, and at bedtime Remainder reviewed in medication history.   Objective: BP 116/67 (BP Location: Left Arm)   Pulse (!) 56   Temp 98.2 F (36.8 C) (Oral)   Resp 18   Ht 6' 1 (1.854 m)   Wt 82.6 kg   SpO2 100%   BMI 24.01 kg/m  Exam: General: Awake and Alert in NAD HEENT: NCAT. Sclera anicteric. No rhinorrhea. No TTP over frontal/maxillary sinuses. Uvula deviated to L. Thrush and exudate noted. Cardiovascular: Irregularly irregular. No M/R/G Respiratory: CTAB, normal WOB on O2 Abdomen: Soft, non-tender, non-distended. Bowel sounds normoactive Extremities: Able to move all extremities. No BLE edema, no deformities or significant joint findings. Skin: Warm and dry. No abrasions or rashes noted. Beefy red, diaper rash noted. Neuro: A&Ox1 (name). CN II: R reactive to light, L pupil is ovoid/postsurgical CN III, IV,VI: EOMI CV V: Normal sensation in V1, V2, V3 CVII: Symmetric smile and brow raise CN VIII: Normal hearing CN IX,X: Symmetric palate raise  CN  XI: 5/5 shoulder shrug CN XII: Symmetric tongue protrusion, uvula deviated to the L UE and LE strength 3/5 Normal sensation in UE and LE bilaterally  No ataxia with finger to nose  Labs:  CBC BMET  Recent Labs  Lab 08/06/23 1106  WBC 8.2  HGB 11.3*  HCT 36.3*  PLT 144*   Recent Labs  Lab 08/06/23 1106  NA 142  K 4.3  CL 107  CO2 21*  BUN 41*  CREATININE 1.97*  GLUCOSE 149*  CALCIUM 9.9    Troponin: 25 > 22  EKG: My own interpretation -A-fib with rate of 59.  PVCs noted.  No ST elevation.  No QTc prolongation.  Imaging Studies  Performed:  CXR: No active disease  CT head w/o contrast 1. No evidence of acute intracranial abnormality. 2. Chronic ischemia including an old right MCA infarct. 3. New bilateral frontal sinus fluid levels. Correlate for acute sinusitis.   MRI Brain w/o contrast 1. No acute intracranial abnormality. 2. Chronic ischemia with multiple old infarcts as above including a right MCA infarct. 3. Fluid in the frontal sinuses.  Correlate for acute sinusitis.  Janna Ferrier, DO 08/06/2023, 5:45 PM PGY-1, [facility]  FPTS Intern pager: [phone], text pages welcome Secure chat group [facility] Teaching Service    FPTS Upper-Level Resident Addendum   I have independently interviewed and examined the patient. I have discussed the above with Dr. Janna and agree with the documented plan. My edits for correction/addition/clarification are included above. Please see any attending notes.   Payton Coward, MD PGY-2, [facility] 08/06/2023 6:04 PM  FPTS Service pager: [phone] (text pages welcome through [employer])

## 2023-08-06 NOTE — Plan of Care (Signed)
 FMTS Brief Progress Note  S: Went to see patient for night rounds.  Patient resting comfortably in bed, no family in room.  Patient states he is feeling well, does not have any concerns or questions at this time.   O: BP 116/67 (BP Location: Left Arm)   Pulse (!) 56   Temp 98.2 F (36.8 C) (Oral)   Resp 18   Ht 6' 1 (1.854 m)   Wt 82.6 kg   SpO2 100%   BMI 24.01 kg/m   General: Resting comfortably in bed, awake and alert, in no acute distress CV: RRR, normal S1/S2 Pulm: Normal WOB on RA, clear to auscultation anteriorly Neuro: Responds appropriately to questions Psych: Pleasantly demented  A/P: Physical deconditioning AKI -Patient appears to be stable at this time, vitals and labs reviewed -Attempted to contact SLP given patient failed bedside swallow, unable to get in touch with them at this time.  Appreciate their assistance and recommendations in a.m. -Continue LR infusion -Will follow plan per day team - Orders reviewed. Labs for AM ordered, which was adjusted as needed.   Adele Song, MD 08/06/2023, 7:38 PM PGY-1, [facility] Medicine Night Resident  Please page [phone] with questions.

## 2023-08-06 NOTE — Assessment & Plan Note (Deleted)
 Candida Infection: Thrush with exudate and diaper rash have been present for quite some time.  Will treat w/ Nystatin suspension and powder. Hyperthyroidism: TSH wnl 3 years ago, not on any chronic antithyroid drugs. Will recheck TSH. HTN: Hypotensive on arrive, normotensive at this time. Will consider restarting home Lisinopril  20 mg daily as indicated. A-fib: Currently in A-fib, not on AC d/t hx of subdural hematoma. Vitamin D deficiency: Takes vitamin D 3 2000 units daily Vitamin B12 deficiency: Takes vitamin B12 1000 mcg daily

## 2023-08-06 NOTE — ED Notes (Signed)
 Transported to MRI

## 2023-08-06 NOTE — Plan of Care (Signed)

## 2023-08-06 NOTE — ED Notes (Signed)
 Admitting at bedside

## 2023-08-06 NOTE — Progress Notes (Deleted)
 Hospital Admission History and Physical Service Pager: [phone]  Patient name: Roger Briggs Medical record number: [id number] Date of Birth: 1929/11/26 Age: 89 y.o. Gender: male  Primary Care Provider: Pcp, No Consultants: None Code Status: DNR/DNI which was confirmed with family Roger Briggs) Preferred Emergency Contact:  Contact Information     Name Relation Home Work Roger Briggs Daughter [phone]      Name Relation Home Work Mobile   [place] Daughter   [phone]   Chief Complaint: Shortness of breath  Assessment and Plan: Lendon George is a 89 y.o. male with PMH of HTN, hyperthyroidism, A-fib not on anticoagulation, subdural hematoma, chronic ischemic MCA infarcts, and dementia presenting with physical deconditioning and AKI. Differential for this patient's presentation of this includes:  AKI - Prerenal likely secondary to dehydration in the setting of possible viral illness/sinusitis and worsening dysphagia. Initially hypotensive which improved with fluid resuscitation.  Deconditioning - Possible secondary to age, history of MCA ischemia, and dementia.  Workup thus far has been negative for acute cause, other than acute sinusitis.  Shortness of breath Viral etiology - Possible with imaging revealing acute sinusitis.  Cardiac - Unlikely due to negative troponins, no hx of chest pain, and Echo from 2021 revealed good heart function with preserved EF, no RWMA, and mild concentric ventricular hypertrophy, mild MVR and trivial AVR.  Assessment & Plan AKI (acute kidney injury) (HCC) Creatinine elevated to 1.97, no clear baseline recently.  However in 2023 baseline ~ 1.0.  Likely prerenal in the setting of dehydration 2/2 decreased appetite possibly d/t dysphagia and ulcers noted at the back of his throat. - Admit to FMTS, attending Dr. Donzetta - Med-Surg, Vital signs per floor - VTE prophylaxis: Lovenox  - Continue LR mIVF - AM CBC/BMP/TSH Physical  deconditioning Dysphagia Likely 2/2 to age vs dementia vs stroke (ruled out by CT and MRI). - Diet NPO, awaiting bedside swallow - PT/OT/SLP consult, appreciate recs - TOC consult for PCP needs - Fall precautions - Delirium precautions Shortness of breath Likely secondary to acute sinusitis vs viral etiology (RPP negative).  Unclear history from patient.  Not on oxygen at baseline, but required nonrebreather due to being hypoxic to 87% on room air.  Currently on [place] in NAD.  CXR revealed no sign for pneumonia or atelectasis. - Wean oxygen as tolerated Chronic health problem Candida Infection: Thrush with exudate and diaper rash have been present for quite some time.  Will treat w/ Nystatin suspension and powder. Hyperthyroidism: TSH wnl 3 years ago, not on any chronic antithyroid drugs. Will recheck TSH. HTN: Hypotensive on arrive, normotensive at this time. Will consider restarting home Lisinopril  20 mg daily as indicated. A-fib: Currently in A-fib, not on AC d/t hx of subdural hematoma. Vitamin D deficiency: Takes vitamin D 3 2000 units daily Vitamin B12 deficiency: Takes vitamin B12 1000 mcg daily  FEN/GI: NPO until SLP eval VTE Prophylaxis: Lovenox   Disposition: Med-Surg  History of Present Illness:  Roger Briggs is a 89 y.o. male presenting with shortness of breath and was found hypoxic and hypotensive at [facility] house.  Patient received 500 mL bolus of fluids to improve his blood pressure.  Normally not on oxygen but was desaturating on room air so was placed on nonrebreather.  Upon arrival to the ED saturations were at 100%.  Daughter was present in the ED and provided history that over the weekend he was refusing to eat and drink.  Endorses some difficulty swallowing, has a dysphagia diet,  and works with speech therapy regularly.  Patient ate breakfast this morning, but it was more difficult and weaker than normal.  He has been reluctant to eat a lot since January.  Reportedly he  was also sleeping all day and could not get out of bed at all yesterday, which is unlike him since he is usually getting around with a walker.  Daughter denied any fever, chest pain, vomiting, diarrhea, or abdominal pain.  She endorses that his speech was very difficult to understand yesterday and today, which is unusual for him.  He did have a fall about 3 weeks ago, no loss of consciousness.  Baseline mentation seems to be that he is aware of himself in the present situation, but never is able to get dates right or do short-term recall.  Long-term recall seems appropriate.  Daughter believes that he has been declining physically more recently and has had a lack of interest in food, possibly related to his dementia.  Patient is able to answer all questions and follow all commands, without much issue.  In the ED, vital signs demonstrated hypotension, bradycardia, and mild tachypnea.  Patient was saturating at 100%.  Troponin 25 and 22 respectively.  CBC with no leukocytosis and hemoglobin of 11.3 .  CMP with normal electrolytes, elevated glucose, elevated creatinine to 1.97, and elevated BUN to 41.  CXR revealed no active disease.  CT head and MRI brain without contrast revealed no acute intracranial abnormality, chronic ischemia in R MCA, and new bilateral frontal sinus acute sinusitis.  Review Of Systems: Per HPI  Pertinent Past Medical History: HTN Hyperthyroidism Dementia A-fib not on anticoagulation d/t hx of subdural hematoma Remainder reviewed in history tab.   Pertinent Past Surgical History: AAA repair Remainder reviewed in history tab.   Pertinent Social History: Tobacco use: Cigars and pipes, daily when he was younger, but no longer smokes Alcohol use: None Other Substance use: None Lives at [facility] (ALF)  Pertinent Family History: Daughter: HTN, Depression Remainder reviewed in history tab.   Important Outpatient Medications: Tylenol  500 mg every 6 hours as  needed Nystatin powder Trazodone  50 mg at bedtime Lisinopril  20 mg daily Loratadine  10 mg daily Vitamin D3 2000 units daily Ferrous sulfate 325 mg on M/W/F Fluticasone 50 mcg/ACT 1 spray in both nostrils daily Lasix  20 mg daily Mag oxide 400 mg daily Menthol zinc oxide 1 application daily to buttocks and groin Vitamin B12 1000 mcg daily Ensure 237 mL by mouth in the morning, noon, and at bedtime Remainder reviewed in medication history.   Objective: BP 116/67 (BP Location: Left Arm)   Pulse (!) 56   Temp 98.2 F (36.8 C) (Oral)   Resp 18   Ht 6' 1 (1.854 m)   Wt 82.6 kg   SpO2 100%   BMI 24.01 kg/m  Exam: General: Awake and Alert in NAD HEENT: NCAT. Sclera anicteric. No rhinorrhea. No TTP over frontal/maxillary sinuses. Uvula deviated to L. Thrush and exudate noted. Cardiovascular: Irregularly irregular. No M/R/G Respiratory: CTAB, normal WOB on O2 Abdomen: Soft, non-tender, non-distended. Bowel sounds normoactive Extremities: Able to move all extremities. No BLE edema, no deformities or significant joint findings. Skin: Warm and dry. No abrasions or rashes noted. Beefy red, diaper rash noted. Neuro: A&Ox1 (name). CN II: R reactive to light, L pupil is ovoid/postsurgical CN III, IV,VI: EOMI CV V: Normal sensation in V1, V2, V3 CVII: Symmetric smile and brow raise CN VIII: Normal hearing CN IX,X: Symmetric palate raise  CN  XI: 5/5 shoulder shrug CN XII: Symmetric tongue protrusion, uvula deviated to the L UE and LE strength 3/5 Normal sensation in UE and LE bilaterally  No ataxia with finger to nose  Labs:  CBC BMET  Recent Labs  Lab 08/06/23 1106  WBC 8.2  HGB 11.3*  HCT 36.3*  PLT 144*   Recent Labs  Lab 08/06/23 1106  NA 142  K 4.3  CL 107  CO2 21*  BUN 41*  CREATININE 1.97*  GLUCOSE 149*  CALCIUM 9.9    Troponin: 25 > 22  EKG: My own interpretation -A-fib with rate of 59.  PVCs noted.  No ST elevation.  No QTc prolongation.  Imaging Studies  Performed:  CXR: No active disease  CT head w/o contrast 1. No evidence of acute intracranial abnormality. 2. Chronic ischemia including an old right MCA infarct. 3. New bilateral frontal sinus fluid levels. Correlate for acute sinusitis.   MRI Brain w/o contrast 1. No acute intracranial abnormality. 2. Chronic ischemia with multiple old infarcts as above including a right MCA infarct. 3. Fluid in the frontal sinuses.  Correlate for acute sinusitis.  Janna Ferrier, DO 08/06/2023, 5:45 PM PGY-1, [facility]  FPTS Intern pager: [phone], text pages welcome Secure chat group [facility] Teaching Service    FPTS Upper-Level Resident Addendum   I have independently interviewed and examined the patient. I have discussed the above with Dr. Janna and agree with the documented plan. My edits for correction/addition/clarification are included above. Please see any attending notes.   Payton Coward, MD PGY-2, [facility] 08/06/2023 6:04 PM  FPTS Service pager: [phone] (text pages welcome through [facility])

## 2023-08-06 NOTE — Assessment & Plan Note (Deleted)
 Creatinine elevated to 1.97, no clear baseline recently.  However in 2023 baseline ~ 1.0.  Likely prerenal in the setting of dehydration 2/2 decreased appetite possibly d/t dysphagia and ulcers noted at the back of his throat. - Admit to FMTS, attending Dr. Donzetta - Med-Surg, Vital signs per floor - VTE prophylaxis: Lovenox  - Continue LR mIVF - AM CBC/BMP/TSH

## 2023-08-06 NOTE — ED Triage Notes (Signed)
 Pt BIB [facility] from [facility] d/t SOB. FD on scene placed him on NRB d/t RA sats of 87% & with NRB he was 100%. Staff at facility endorses oriented at baseline, uses a walker but was feeling weak & needing additional help to ambulate/transfer. EMS reports 80/64 then after 500cc NS via 20g Lt FA PIV it was 109/78.

## 2023-08-06 NOTE — Assessment & Plan Note (Deleted)
 Hgb 11.3 with MCV of 99.7. -

## 2023-08-07 ENCOUNTER — Inpatient Hospital Stay (HOSPITAL_COMMUNITY)

## 2023-08-07 DIAGNOSIS — N179 Acute kidney failure, unspecified: Secondary | ICD-10-CM | POA: Diagnosis not present

## 2023-08-07 DIAGNOSIS — R0609 Other forms of dyspnea: Secondary | ICD-10-CM

## 2023-08-07 LAB — BASIC METABOLIC PANEL WITH GFR
Anion gap: 7 (ref 5–15)
BUN: 37 mg/dL — ABNORMAL HIGH (ref 8–23)
CO2: 23 mmol/L (ref 22–32)
Calcium: 9.5 mg/dL (ref 8.9–10.3)
Chloride: 112 mmol/L — ABNORMAL HIGH (ref 98–111)
Creatinine, Ser: 1.73 mg/dL — ABNORMAL HIGH (ref 0.61–1.24)
GFR, Estimated: 37 mL/min — ABNORMAL LOW (ref >60)
Glucose, Bld: 99 mg/dL (ref 70–99)
Potassium: 4.4 mmol/L (ref 3.5–5.1)
Sodium: 142 mmol/L (ref 135–145)

## 2023-08-07 LAB — URINALYSIS, ROUTINE W REFLEX MICROSCOPIC
Bilirubin Urine: NEGATIVE
Glucose, UA: NEGATIVE mg/dL
Ketones, ur: NEGATIVE mg/dL
Nitrite: NEGATIVE
Protein, ur: 100 mg/dL — AB
Specific Gravity, Urine: 1.012 (ref 1.005–1.030)
WBC, UA: >50 WBC/hpf (ref 0–5)
pH: 6 (ref 5.0–8.0)

## 2023-08-07 LAB — CBC
HCT: 34.6 % — ABNORMAL LOW (ref 39.0–52.0)
Hemoglobin: 10.9 g/dL — ABNORMAL LOW (ref 13.0–17.0)
MCH: 30.8 pg (ref 26.0–34.0)
MCHC: 31.5 g/dL (ref 30.0–36.0)
MCV: 97.7 fL (ref 80.0–100.0)
Platelets: 113 10*3/uL — ABNORMAL LOW (ref 150–400)
RBC: 3.54 MIL/uL — ABNORMAL LOW (ref 4.22–5.81)
RDW: 12.7 % (ref 11.5–15.5)
WBC: 5.6 10*3/uL (ref 4.0–10.5)
nRBC: 0 % (ref 0.0–0.2)

## 2023-08-07 LAB — ECHOCARDIOGRAM COMPLETE
Height: 73 in
S' Lateral: 3 cm
Weight: 2912 [oz_av]

## 2023-08-07 MED ORDER — LACTATED RINGERS IV SOLN: INTRAVENOUS | Status: AC

## 2023-08-07 MED ORDER — PERFLUTREN LIPID MICROSPHERE
1.0000 mL | INTRAVENOUS | Status: AC | PRN
  Administered 2023-08-07: 5 mL via INTRAVENOUS

## 2023-08-07 NOTE — TOC CM/SW Note (Addendum)
 Transition of Care [facility]) - Inpatient Brief Assessment   Patient Details  Name: Roger Briggs MRN: [id number] Date of Birth: 1929-09-01  Transition of Care [facility]) CM/SW Contact:    Lauraine FORBES Saa, LCSW Phone Number: 08/07/2023, 11:34 AM   Clinical Narrative:  11:34 AM Per chart review, patient resides at ALF. Patient's daughter, Sharlet, confirmed patient resides at [facility]F. Sharlet stated that patient receives assistance from ALF staff but expressed interest in home services to manage wound. Patient has insurance and is followed by Cardiology but does not have a PCP. Patient does not have SNF or DME history. Patient has HH history with [facility]. Patient does not have a preferred pharmacy. TOC will continue to follow.  2:46 PM CSW sent referral to [facility].  Transition of Care Asessment: Insurance and Status: Insurance coverage has been reviewed Patient has primary care physician: No Home environment has been reviewed: [facility] Prior level of function:: N/A Prior/Current Home Services: No current home services (Has HH history) Social Drivers of Health Review: SDOH reviewed no interventions necessary Readmission risk has been reviewed: Yes Transition of care needs: transition of care needs identified, TOC will continue to follow

## 2023-08-07 NOTE — Assessment & Plan Note (Signed)
 Cr improved from 1.97 > 1.73 s/p mIVF. - Continue LR mIVF - AM CBC/BMP

## 2023-08-07 NOTE — Evaluation (Signed)
 Modified Barium Swallow Study  Patient Details  Name: Roger Briggs MRN: [id number] Date of Birth: November 11, 1929  Today's Date: 08/07/2023  Modified Barium Swallow completed.  Full report located under Chart Review in the Imaging Section.  History of Present Illness Roger Briggs is a 89 yo M who presents from ALF with weakness, decreased PO intake over the weekend, and found to have AKI.  CT head and MRI without acute changes. CXR without acute changes. with a PMH of HTN, hyperthyroidism, Afib not on AC, subdural hematoma, h/o ischemia VA, dementia (not oriented to location/time at baseline per daughter)   Clinical Impression Patient presents with moderate pharyngeal dysphagia as characterized primarily by swallow mistiming resulting in variably silent and audible aspiration of thin liquids. Further, pateint demonstrates reduced pharyngeal efficiency subsequent to reduced pharyngeal stripping wave and partial UES distention resulting in diffuse pharyngeal residue which increases in amount alongside bolus viscosity. Patient exhibits mildly prolonged mastication with regular solids due to missing dentition but overall compensates well orally. With first few sips of liquid, no aspiration appreciated. However, as residue from solids built up, thin liquids from larger sips spilled into the pyriform sinuses and overflowed into the laryngeal vestibule, eventually grossly aspirated during the swallow with straw and cup sips. Aspiration was sometimes subtly sensed but at other times was silent. No aspiration observed with nectar thick liquids. Given patient's baseline dementia, compensatory strategies not indicated. Recommend Dys3/NTL diet at this time with plans to monitor tolerance and upgrade as able. Administer medications crushed in puree. Patient may benefit from [place]ale cup to limit bolus size with thin liquids; will trial at next opportunity. Factors that may increase risk of adverse event in presence of  aspiration Noe & Lianne 2021): Weak cough;Reduced cognitive function  Swallow Evaluation Recommendations Recommendations: PO diet PO Diet Recommendation: Dysphagia 3 (Mechanical soft);Mildly thick liquids (Level 2, nectar thick) Liquid Administration via: Cup Medication Administration: Crushed with puree Supervision: Full supervision/cueing for swallowing strategies Swallowing strategies  : Small bites/sips Postural changes: Position pt fully upright for meals Oral care recommendations: Oral care BID (2x/day) Caregiver Recommendations: Avoid jello, ice cream, thin soups, popsicles     Rosina Downy, M.A., CCC-SLP  Rosina A Evanthia Maund 08/07/2023,12:04 PM

## 2023-08-07 NOTE — TOC Progression Note (Signed)
 Transition of Care [facility]) - Progression Note    Patient Details  Name: Roger Briggs MRN: [id number] Date of Birth: 1929/03/08  Transition of [facility]) CM/SW Contact  Marval Gell, RN Phone Number: 08/07/2023, 11:55 AM  Clinical Narrative:     Followed up with Channing at [facility]. Patient si active for [facility] nursing for B leg wound care. Per CSW daughter states she wants to continue to [facility] services at ALF.  Channing will follow chart.   Expected Discharge Plan: Assisted Living Barriers to Discharge: Continued Medical Work up  Expected Discharge Plan and Services In-house Referral: Clinical Social Work Discharge Planning Services: CM Consult                               HH Arranged: Charity fundraiser, PT HH Agency: [facility] Date HH Agency Contacted: 08/07/23 Time HH Agency Contacted: 1155 Representative spoke with at [facility] Agency: Channing   Social Determinants of Health (SDOH) Interventions SDOH Screenings   Food Insecurity: No Food Insecurity (08/06/2023)  Housing: Low Risk  (08/06/2023)  Transportation Needs: No Transportation Needs (08/06/2023)  Utilities: Not At Risk (08/06/2023)  Social Connections: Socially Isolated (08/06/2023)  Tobacco Use: Medium Risk (08/06/2023)    Readmission Risk Interventions     No data to display

## 2023-08-07 NOTE — Assessment & Plan Note (Addendum)
 Candida Infection: Thrush with exudate and diaper rash have been present for quite some time.  Will treat w/ Nystatin suspension and powder. Hyperthyroidism: TSH wnl 3 years ago, but low at 0.210 today, was previously on methimazole , likely risk vs benefit discussion to discontinue methimazole  in the past w/ age, can confirm with daughter if she would like further workup. HTN: Hypotensive on arrive, normotensive at this time. Will likely discontinue Lisinopril  20 mg daily at discharge. A-fib: Currently in A-fib, not on AC d/t hx of subdural hematoma. Vitamin D deficiency: Takes vitamin D 3 2000 units daily at home Vitamin B12 deficiency: Takes vitamin B12 1000 mcg daily at home Iron Deficiency Anemia: On Ferrous sulfate 325 mg at home on M/W/F

## 2023-08-07 NOTE — Evaluation (Signed)
 Occupational Therapy Evaluation Patient Details Name: Roger Briggs MRN: [id number] DOB: Jun 24, 1929 Today's Date: 08/07/2023   History of Present Illness   Mr. Roger Briggs is a 89 yo M who presents from ALF with weakness, decreased PO intake over the weekend, and found to have AKI.  CT head and MRI without acute changes. CXR without acute changes. with a PMH of HTN, hyperthyroidism, Afib not on AC, subdural hematoma, h/o ischemia VA, dementia (not oriented to location/time at baseline per daughter)     Clinical Impressions Pt resting in bed comfortably, no complaints, daughter present during session. Pt lives at assisted living, PLOF needs max A for dressing/bathing, CGA for mobility with RW, uses briefs for toileting, daughter reports getting rashes in groin from being wet for too long. Pt currently likely at baseline, min A for STS and mobility around room short distances with RW for support, max A for ADLs. Pt has history of dementia at baseline, but able to participate and follow commands as needed. At this time recommending return to ALF with HHOT follow up if Pt continues to have consistent support, will follow acutely to progress as able.  BP supine 96/54 HR 46 BP sitting 102/70 HR 75 BP standing 99/66 HR 76 No dizziness or other symptoms with OOB activities     If plan is discharge home, recommend the following:   A little help with bathing/dressing/bathroom;Assistance with cooking/housework;Assist for transportation;Help with stairs or ramp for entrance;A lot of help with walking and/or transfers     Functional Status Assessment   Patient has had a recent decline in their functional status and demonstrates the ability to make significant improvements in function in a reasonable and predictable amount of time.     Equipment Recommendations   None recommended by OT     Recommendations for Other Services         Precautions/Restrictions   Precautions Precautions:  Fall Recall of Precautions/Restrictions: Impaired Restrictions Weight Bearing Restrictions Per Provider Order: No     Mobility Bed Mobility Overal bed mobility: Needs Assistance Bed Mobility: Supine to Sit     Supine to sit: Mod assist     General bed mobility comments: mod A with HOB elevated, help with scooting to EOB.    Transfers Overall transfer level: Needs assistance Equipment used: Rolling walker (2 wheels) Transfers: Sit to/from Stand, Bed to chair/wheelchair/BSC Sit to Stand: Min assist     Step pivot transfers: Contact guard assist     General transfer comment: min A for STS, ones up CGA for short distances with RW      Balance Overall balance assessment: Needs assistance Sitting-balance support: No upper extremity supported, Feet supported Sitting balance-Leahy Scale: Fair Sitting balance - Comments: able to sit EOB, slight posterior lean, able to self correct sitting balance   Standing balance support: Bilateral upper extremity supported, During functional activity, Reliant on assistive device for balance Standing balance-Leahy Scale: Poor Standing balance comment: reliant on RW for support                           ADL either performed or assessed with clinical judgement   ADL Overall ADL's : At baseline;Needs assistance/impaired Eating/Feeding: Set up;Sitting   Grooming: Minimal assistance;Sitting   Upper Body Bathing: Maximal assistance   Lower Body Bathing: Maximal assistance   Upper Body Dressing : Maximal assistance   Lower Body Dressing: Maximal assistance   Toilet Transfer: Minimal assistance;Rolling walker (2 wheels)  Toileting- Clothing Manipulation and Hygiene: Maximal assistance       Functional mobility during ADLs: Minimal assistance;Rolling walker (2 wheels) General ADL Comments: Pt at baseline, max A for dressing/bathing, min A for transfers and mobility with RW     Vision Baseline Vision/History: 0 No visual  deficits Ability to See in Adequate Light: 0 Adequate Patient Visual Report: No change from baseline       Perception         Praxis         Pertinent Vitals/Pain Pain Assessment Pain Assessment: No/denies pain     Extremity/Trunk Assessment Upper Extremity Assessment Upper Extremity Assessment: Generalized weakness;RUE deficits/detail;LUE deficits/detail RUE Deficits / Details: significant stiffness in B shoulders limiting functional independence, max A for UB dressing, able to self feed and assist in grooming RUE Sensation: WNL RUE Coordination: decreased gross motor LUE Deficits / Details: significant stiffness in B shoulders limiting functional independence, max A for UB dressing, able to self feed and assist in grooming LUE Sensation: WNL LUE Coordination: decreased gross motor   Lower Extremity Assessment Lower Extremity Assessment: Defer to PT evaluation       Communication Communication Communication: Impaired Factors Affecting Communication: Hearing impaired   Cognition Arousal: Alert Behavior During Therapy: WFL for tasks assessed/performed Cognition: Cognition impaired   Orientation impairments: Time, Situation         OT - Cognition Comments: advanced dementia, impaired at baseline, able to follow commands and participate, not oriented to time/situation, oriented to being at a general hospital.                 Following commands: Intact       Cueing  General Comments   Cueing Techniques: Verbal cues  BP supine 96/54 HR 46, sitting EOB 102/70 HR 75, standing 99/66 HR 76, no dizziness   Exercises     Shoulder Instructions      Home Living Family/patient expects to be discharged to:: Assisted living                             Home Equipment: Agricultural consultant (2 wheels);Shower seat   Additional Comments: Lives at assisted living      Prior Functioning/Environment Prior Level of Function : Needs assist              Mobility Comments: RW, does not mobilize on his own often, 3 falls in the last year ADLs Comments: significant assistance for ADLs, wears diapers    OT Problem List: Decreased strength;Decreased range of motion;Decreased activity tolerance;Impaired balance (sitting and/or standing);Decreased cognition;Decreased safety awareness;Pain;Impaired UE functional use   OT Treatment/Interventions: Self-care/ADL training;Therapeutic exercise;DME and/or AE instruction;Manual therapy;Therapeutic activities;Patient/family education;Balance training      OT Goals(Current goals can be found in the care plan section)   Acute Rehab OT Goals Patient Stated Goal: to improve strength OT Goal Formulation: With patient/family Time For Goal Achievement: 08/21/23 Potential to Achieve Goals: Good   OT Frequency:  Min 1X/week    Co-evaluation              AM-PAC OT 6 Clicks Daily Activity     Outcome Measure Help from another person eating meals?: A Little Help from another person taking care of personal grooming?: A Lot Help from another person toileting, which includes using toliet, bedpan, or urinal?: A Lot Help from another person bathing (including washing, rinsing, drying)?: A Lot Help from another person to put on  and taking off regular upper body clothing?: A Lot Help from another person to put on and taking off regular lower body clothing?: A Lot 6 Click Score: 13   End of Session Equipment Utilized During Treatment: Gait belt;Rolling walker (2 wheels) Nurse Communication: Mobility status;Other (comment) (left in recliner with chair alarm but no alarm box)  Activity Tolerance: Patient tolerated treatment well Patient left: in chair;with call bell/phone within reach;with family/visitor present  OT Visit Diagnosis: Unsteadiness on feet (R26.81);Other abnormalities of gait and mobility (R26.89);Muscle weakness (generalized) (M62.81);History of falling (Z91.81);Other symptoms and signs  involving cognitive function                Time: 8946-8867 OT Time Calculation (min): 39 min Charges:  OT General Charges $OT Visit: 1 Visit OT Treatments $Self Care/Home Management : 8-22 mins  [place], OTR/L   Elouise JONELLE Bott 08/07/2023, 11:51 AM

## 2023-08-07 NOTE — NC FL2 (Signed)
 [place]  MEDICAID FL2 LEVEL OF CARE FORM     IDENTIFICATION  Patient Name: Roger Briggs Birthdate: 1929/03/07 Sex: male Admission Date (Current Location): 08/06/2023  [facility] and [place] Number:  Producer, television/film/video and Address:  [facility], [address] [place], [place] [address]      Provider Number: [id number]  Attending Physician Name and Address:  Donzetta Rollene BRAVO, MD  Relative Name and Phone Number:  Sharlet Saba; Daughter; [phone]    Current Level of Care: Hospital Recommended Level of Care: Assisted Living Facility (ALF with [facility]) Prior Approval Number:    Date Approved/Denied:   PASRR Number:    Discharge Plan: Other (Comment) (ALF with [facility])    Current Diagnoses: Patient Active Problem List   Diagnosis Date Noted   AKI (acute kidney injury) (HCC) 08/06/2023   Physical deconditioning 08/06/2023   Dysphagia 08/06/2023   Shortness of breath 08/06/2023   Anemia 08/06/2023   Chronic health problem 08/06/2023   Thrombocytopenia (HCC) 07/23/2021   CAP (community acquired pneumonia) 07/22/2021   Sinus bradycardia 07/22/2021   Dementia without behavioral disturbance (HCC) 07/22/2021   Essential hypertension 07/22/2021   Thoracic aortic aneurysm (HCC) 07/22/2021   DNR (do not resuscitate) 07/22/2021   Hyperthyroidism 12/10/2017    Orientation RESPIRATION BLADDER Height & Weight     Self  Normal (Room Air) Incontinent Weight: 182 lb (82.6 kg) Height:  6' 1 (185.4 cm)  BEHAVIORAL SYMPTOMS/MOOD NEUROLOGICAL BOWEL NUTRITION STATUS      Continent Diet (Please see discharge summary)  AMBULATORY STATUS COMMUNICATION OF NEEDS Skin   Limited Assist Verbally Normal                       Personal Care Assistance Level of Assistance  Bathing, Feeding, Dressing Bathing Assistance: Limited assistance Feeding assistance: Limited assistance Dressing Assistance: Limited assistance     Functional Limitations Info              SPECIAL CARE FACTORS FREQUENCY  PT (By licensed PT), OT (By licensed OT), Speech therapy     PT Frequency: 2x OT Frequency: 2x     Speech Therapy Frequency: 2x      Contractures Contractures Info: Not present    Additional Factors Info  Code Status, Allergies Code Status Info: DNR-LIMITED -Do Not Intubate/DNI Allergies Info: NKA           Current Medications (08/07/2023):  This is the current hospital active medication list Current Facility-Administered Medications  Medication Dose Route Frequency Provider Last Rate Last Admin   acetaminophen  (TYLENOL ) tablet 650 mg  650 mg Oral Q6H PRN Gomes, Adriana, DO       Or   acetaminophen  (TYLENOL ) suppository 650 mg  650 mg Rectal Q6H PRN Gomes, Adriana, DO       enoxaparin  (LOVENOX ) injection 30 mg  30 mg Subcutaneous Q24H Gomes, Adriana, DO   30 mg at 08/06/23 1818   lactated ringers infusion   Intravenous Continuous Janna Ferrier, DO 125 mL/hr at 08/07/23 1316 New Bag at 08/07/23 1316   nystatin (MYCOSTATIN) 100000 UNIT/ML suspension 500,000 Units  5 mL Oral QID Gomes, Adriana, DO       nystatin (MYCOSTATIN/NYSTOP) topical powder 1 Application  1 Application Topical BID Gomes, Adriana, DO   1 Application at 08/07/23 1108   traZODone  (DESYREL ) tablet 25 mg  25 mg Oral QHS Gomes, Adriana, DO         Discharge Medications: Please see discharge  summary for a list of discharge medications.  Relevant Imaging Results:  Relevant Lab Results:   Additional Information SSN: [id number]  Lauraine FORBES Saa, LCSW

## 2023-08-07 NOTE — Progress Notes (Signed)
 Daily Progress Note Intern Pager: [phone]  Patient name: Roger Briggs Medical record number: [id number] Date of birth: 02-23-1929 Age: 89 y.o. Gender: male  Primary Care Provider: Pcp, No Consultants: PT/OT/SLP, TOC Code Status: DNR/DNI  Pt Overview and Major Events to Date:  6/23: Admitted  Assessment and Plan: Roger Briggs is a 89 year old male with PMHx of HTN, hypothyroidism, A-fib not on anticoagulation due to history of subdural hematoma, chronic ischemic MCA infarcts, and dementia presenting with physical deconditioning and AKI likely secondary to viral etiology with imaging revealing acute sinusitis vs dementia.  Patient has been fluid resuscitated and will continue fluids while NPO due to failing bedside swallow eval.  Awaiting PT/OT/SLP recommendations. Assessment & Plan AKI (acute kidney injury) (HCC) Cr improved from 1.97 > 1.73 s/p mIVF. - Continue LR mIVF - AM CBC/BMP Physical deconditioning Dysphagia - SLP performed barium swallow study > Dysphagia 3 diet - Could consider Echo vs CT w/o contrast to further workup the hypotension/hypoxia on arrival, will discuss w/ daughter - PT/OT/SLP consulted, appreciate recs - TOC consulted for PCP needs - Fall precautions - Delirium precautions Shortness of breath Saturating 100% on 2L [place]. - Wean oxygen as tolerated  Chronic health problem Candida Infection: Thrush with exudate and diaper rash have been present for quite some time.  Will treat w/ Nystatin suspension and powder. Hyperthyroidism: TSH wnl 3 years ago, but low at 0.210 today, was previously on methimazole , likely risk vs benefit discussion to discontinue methimazole  in the past w/ age, can confirm with daughter if she would like further workup. HTN: Hypotensive on arrive, normotensive at this time. Will likely discontinue Lisinopril  20 mg daily at discharge. A-fib: Currently in A-fib, not on AC d/t hx of subdural hematoma. Vitamin D deficiency: Takes  vitamin D 3 2000 units daily at home Vitamin B12 deficiency: Takes vitamin B12 1000 mcg daily at home Iron Deficiency Anemia: On Ferrous sulfate 325 mg at home on M/W/F  FEN/GI: Dysphagia 3 diet PPx: Lovenox  Dispo: Pending clinical improvement  Subjective:  Patient is doing well this morning and has no concerns.  Mentation remains at baseline.  Reports that his mouth is still dry.  No pain.  Objective: Temp:  [97.6 F (36.4 C)-98.2 F (36.8 C)] 98 F (36.7 C) (06/24 0822) Pulse Rate:  [47-63] 53 (06/24 0822) Resp:  [15-23] 18 (06/24 0539) BP: (95-141)/(58-85) 116/63 (06/24 0822) SpO2:  [92 %-100 %] 100 % (06/24 0822) Weight:  [82.6 kg] 82.6 kg (06/23 1116) Physical Exam: General: Awake and Alert in NAD HEENT: NCAT. Sclera anicteric. No rhinorrhea. Uvula deviated to L. Thrush and exudate noted. Cardiovascular: Irregularly irregular. No M/R/G Respiratory: CTAB, normal WOB on RA. No wheezing, crackles, rhonchi, or diminished breath sounds. Abdomen: Soft, non-tender, non-distended. Bowel sounds normoactive Extremities: Able to move all extremities. No BLE edema, no deformities or significant joint findings. Skin: Warm and dry. No abrasions or rashes noted. Neuro: A&Ox2 (name, location). 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  Laboratory: Most recent CBC Lab Results  Component Value Date   WBC 5.6 08/07/2023   HGB 10.9 (L) 08/07/2023   HCT 34.6 (L) 08/07/2023   MCV 97.7 08/07/2023   PLT 113 (L) 08/07/2023   Most recent BMP    Latest Ref Rng & Units 08/07/2023    4:53 AM  BMP  Glucose 70 - 99 mg/dL 99   BUN 8 - 23 mg/dL 37   Creatinine 9.38 - 1.24 mg/dL 8.26   Sodium 864 - 854 mmol/L 142  Potassium 3.5 - 5.1 mmol/L 4.4   Chloride 98 - 111 mmol/L 112   CO2 22 - 32 mmol/L 23   Calcium 8.9 - 10.3 mg/dL 9.5    Imaging/Diagnostic Tests: No new imaging.  Janna Ferrier, DO 08/07/2023, 10:58 AM  PGY-1, [facility] FPTS Intern pager: [phone], text  pages welcome Secure chat group [facility] Teaching Service

## 2023-08-07 NOTE — Assessment & Plan Note (Addendum)
-   SLP performed barium swallow study > Dysphagia 3 diet - Could consider Echo vs CT w/o contrast to further workup the hypotension/hypoxia on arrival, will discuss w/ daughter - PT/OT/SLP consulted, appreciate recs - TOC consulted for PCP needs - Fall precautions - Delirium precautions

## 2023-08-07 NOTE — Evaluation (Signed)
 Clinical/Bedside Swallow Evaluation Patient Details  Name: Roger Briggs MRN: [id number] Date of Birth: 1929-06-02  Today's Date: 08/07/2023 Time: SLP Start Time (ACUTE ONLY): 9148 SLP Stop Time (ACUTE ONLY): 0905 SLP Time Calculation (min) (ACUTE ONLY): 14 min  Past Medical History:  Past Medical History:  Diagnosis Date   HTN (hypertension)    Hyperthyroidism    Mild cognitive impairment    Past Surgical History:  Past Surgical History:  Procedure Laterality Date   ABDOMINAL AORTIC ANEURYSM REPAIR     HPI:  Mr. Roger Briggs is a 89 yo M who presents from ALF with weakness, decreased PO intake over the weekend, and found to have AKI.  CT head and MRI without acute changes. CXR without acute changes. with a PMH of HTN, hyperthyroidism, Afib not on AC, subdural hematoma, h/o ischemia VA, dementia (not oriented to location/time at baseline per daughter)    Assessment / Plan / Recommendation  Clinical Impression  Patient presents at bedside with overt s/sx concerning for aspiration with thin liquids presented both via straw and cup. Patient reports no difficulty swallowing pta though likely not an accurate historian due to baseline dementia. Patient exhibits no overt difficulty with tasks presented during oral mechanism exam, no weakness or limited range of motion appreciated. Patient consumes soft solids at baseline due to poor dentition (2 teeth observed). During regular solid trials, exhibited prolonged mastication but managed well overall. Patient exhibited immediate cough with thin liquids from straw in 2/2 trials and from cup in 1/5 trials. Recommend MBS to objectively assess oropharyngeal swallowing function prior to diet initiation. SLP will plan to complete 6/24. Await completion of study prior to formal diet recommendation. SLP Visit Diagnosis: Dysphagia, unspecified (R13.10)    Aspiration Risk  Moderate aspiration risk    Diet Recommendation  (defer until results from MBS are  rendered)         Other  Recommendations Oral Care Recommendations: Oral care BID     Assistance Recommended at Discharge    Functional Status Assessment Patient has had a recent decline in their functional status and demonstrates the ability to make significant improvements in function in a reasonable and predictable amount of time.  Frequency and Duration min 2x/week  2 weeks       Prognosis Prognosis for improved oropharyngeal function: Good Barriers to Reach Goals: Cognitive deficits      Swallow Study   General Date of Onset: 08/06/23 HPI: Mr. Roger Briggs is a 89 yo M who presents from ALF with weakness, decreased PO intake over the weekend, and found to have AKI.  CT head and MRI without acute changes. CXR without acute changes. with a PMH of HTN, hyperthyroidism, Afib not on AC, subdural hematoma, h/o ischemia VA, dementia (not oriented to location/time at baseline per daughter) Type of Study: Bedside Swallow Evaluation Previous Swallow Assessment: n/a Diet Prior to this Study: NPO Temperature Spikes Noted: No Respiratory Status: Room air History of Recent Intubation: No Behavior/Cognition: Alert;Cooperative;Pleasant mood Oral Cavity Assessment: Within Functional Limits Oral Care Completed by SLP: No Oral Cavity - Dentition: Missing dentition;Poor condition Vision: Functional for self-feeding Self-Feeding Abilities: Able to feed self Patient Positioning: Upright in bed Baseline Vocal Quality: Normal Volitional Cough: Weak Volitional Swallow: Able to elicit    Oral/Motor/Sensory Function Overall Oral Motor/Sensory Function: Within functional limits   Ice Chips Ice chips: Not tested   Thin Liquid Thin Liquid: Impaired Presentation: Cup;Straw Pharyngeal  Phase Impairments: Cough - Immediate;Cough - Delayed    Nectar Thick Nectar  Thick Liquid: Not tested   Honey Thick Honey Thick Liquid: Not tested   Puree Puree: Within functional limits   Solid     Solid:  Impaired Presentation: Self Fed Oral Phase Impairments: Impaired mastication Oral Phase Functional Implications: Impaired mastication     Rosina Downy, M.A., CCC-SLP  Rosina LABOR Ercell Razon 08/07/2023,11:53 AM

## 2023-08-07 NOTE — Assessment & Plan Note (Signed)
 Saturating 100% on 2L [place]. - Wean oxygen as tolerated

## 2023-08-07 NOTE — Plan of Care (Signed)
 Called Roger Briggs (daughter) to discuss patient's goals of care further:  The family would like to avoid invasive measures. We discussed that with his history of hyperthyroidism a TSH was ordered which was low, can proceed with a T4 to further evaluate, she agrees.  Is willing to treat hyperthyroidism if indicated. Etiology for hypoxia and hypotension was also discussed at this time.  Offered the possibility of echo versus CT chest without contrast to further workup the cause.  Daughter was on board with getting an Echo, but was agreeable to holding off on the CT chest without contrast to see how he does once weaned off oxygen to room air. There is history of smoking with no known diagnosis of COPD.  Will attempt to wean to room air as tolerable.  Could pursue further workup if still hypoxic. Daughter would like us  to continue pursuing to get a UA at this time. According to daughter, he sees a PCP at the ALF who does home visits for the residents there. Aside from that sees a cardiologist.  Roger Melena, DO 08/07/23 12:23 PM

## 2023-08-07 NOTE — Evaluation (Signed)
 Physical Therapy Evaluation Patient Details Name: Roger Briggs MRN: [id number] DOB: 1929/11/21 Today's Date: 08/07/2023  History of Present Illness  Roger Briggs is a 89 yo M who presents from ALF with weakness, decreased PO intake over the weekend, and found to have AKI.  CT head and MRI without acute changes. PMH: HTN, hyperthyroidism, Afib not on AC, subdural hematoma, h/o ischemia VA, dementia (not oriented to location/time at baseline per daughter)   Clinical Impression  Pt admitted with above. PTA Pt residing at [facility] receiving maxA for ADLs and minA for ambulation. Pt with noted h/o dementia. Pt near baseline level of function. Pt requiring modA for bed mobility and minA for amb with RW. Pt does require max directional cues to initiate mobility. Acute PT to cont to follow. Pt to recommend HHPT to assist with strengthening, impaired balance, and increasing overall mobility.        If plan is discharge home, recommend the following: A little help with walking and/or transfers;A lot of help with bathing/dressing/bathroom;Assist for transportation;Supervision due to cognitive status   Can travel by private vehicle        Equipment Recommendations None recommended by PT  Recommendations for Other Services       Functional Status Assessment Patient has had a recent decline in their functional status and demonstrates the ability to make significant improvements in function in a reasonable and predictable amount of time.     Precautions / Restrictions Precautions Precautions: Fall Recall of Precautions/Restrictions: Impaired Restrictions Weight Bearing Restrictions Per Provider Order: No      Mobility  Bed Mobility Overal bed mobility: Needs Assistance Bed Mobility: Supine to Sit     Supine to sit: Mod assist     General bed mobility comments: mod A with HOB elevated, help with scooting to EOB.    Transfers Overall transfer level: Needs assistance Equipment  used: Rolling walker (2 wheels) Transfers: Sit to/from Stand, Bed to chair/wheelchair/BSC Sit to Stand: Min assist           General transfer comment: bed elevated, minA to power up, increased time    Ambulation/Gait Ambulation/Gait assistance: Min assist Gait Distance (Feet): 25 Feet Assistive device: Rolling walker (2 wheels) Gait Pattern/deviations: Decreased stride length, Step-to pattern, Shuffle, Trunk flexed, Narrow base of support Gait velocity: dec Gait velocity interpretation: <1.31 ft/sec, indicative of household ambulator   General Gait Details: pt with short step height and length, near fenestating/shuffling gait pattern, minA for walker management around obstacles, significant trunk flexion  Stairs            Wheelchair Mobility     Tilt Bed    Modified Rankin (Stroke Patients Only)       Balance Overall balance assessment: Needs assistance Sitting-balance support: No upper extremity supported, Feet supported Sitting balance-Leahy Scale: Fair Sitting balance - Comments: able to sit EOB, slight posterior lean, able to self correct sitting balance   Standing balance support: Bilateral upper extremity supported, During functional activity, Reliant on assistive device for balance Standing balance-Leahy Scale: Poor Standing balance comment: reliant on RW for support                             Pertinent Vitals/Pain Pain Assessment Pain Assessment: No/denies pain    Home Living Family/patient expects to be discharged to:: Assisted living                 Home Equipment: Rolling  Walker (2 wheels);Shower seat Additional Comments: Lives at assisted living - [facility]    Prior Function Prior Level of Function : Needs assist             Mobility Comments: RW, does not mobilize on his own often, 3 falls in the last year ADLs Comments: significant assistance for ADLs, wears diapers, can feed self but needs to be reminded to  eat     Extremity/Trunk Assessment   Upper Extremity Assessment Upper Extremity Assessment: Defer to OT evaluation RUE Deficits / Details: significant stiffness in B shoulders limiting functional independence, max A for UB dressing, able to self feed and assist in grooming RUE Sensation: WNL RUE Coordination: decreased gross motor LUE Deficits / Details: significant stiffness in B shoulders limiting functional independence, max A for UB dressing, able to self feed and assist in grooming LUE Sensation: WNL LUE Coordination: decreased gross motor    Lower Extremity Assessment Lower Extremity Assessment: Generalized weakness (noted increased bilat hip and knee flexion)    Cervical / Trunk Assessment Cervical / Trunk Assessment: Kyphotic  Communication   Communication Communication: Impaired Factors Affecting Communication: Hearing impaired    Cognition Arousal: Alert Behavior During Therapy: WFL for tasks assessed/performed   PT - Cognitive impairments: History of cognitive impairments (known dementia)                         Following commands: Intact       Cueing Cueing Techniques: Verbal cues     General Comments General comments (skin integrity, edema, etc.): BP soft, 90s/50s however asymptomatic    Exercises     Assessment/Plan    PT Assessment Patient needs continued PT services  PT Problem List Decreased strength;Decreased range of motion;Decreased activity tolerance;Decreased balance;Decreased mobility;Decreased knowledge of use of DME       PT Treatment Interventions DME instruction;Gait training;Functional mobility training;Therapeutic activities;Therapeutic exercise;Balance training;Neuromuscular re-education    PT Goals (Current goals can be found in the Care Plan section)  Acute Rehab PT Goals Patient Stated Goal: didn't state PT Goal Formulation: With patient/family Time For Goal Achievement: 08/21/23 Potential to Achieve Goals: Good     Frequency Min 2X/week     Co-evaluation               AM-PAC PT 6 Clicks Mobility  Outcome Measure Help needed turning from your back to your side while in a flat bed without using bedrails?: A Lot Help needed moving from lying on your back to sitting on the side of a flat bed without using bedrails?: A Lot Help needed moving to and from a bed to a chair (including a wheelchair)?: A Little Help needed standing up from a chair using your arms (e.g., wheelchair or bedside chair)?: A Little Help needed to walk in hospital room?: A Little Help needed climbing 3-5 steps with a railing? : A Lot 6 Click Score: 15    End of Session Equipment Utilized During Treatment: Gait belt Activity Tolerance: Patient tolerated treatment well Patient left: in chair;with call bell/phone within reach;with chair alarm set;with family/visitor present;with nursing/sitter in room Nurse Communication: Mobility status PT Visit Diagnosis: Unsteadiness on feet (R26.81);Muscle weakness (generalized) (M62.81);Difficulty in walking, not elsewhere classified (R26.2)    Time: 8893-8867 PT Time Calculation (min) (ACUTE ONLY): 26 min   Charges:   PT Evaluation $PT Eval Moderate Complexity: 1 Mod   PT General Charges $$ ACUTE PT VISIT: 1 Visit  Cynithia Hakimi, PT, DPT Acute Rehabilitation Services Secure chat preferred Office #: [phone]   Norene CHRISTELLA Ames 08/07/2023, 1:30 PM

## 2023-08-07 NOTE — Plan of Care (Signed)

## 2023-08-08 ENCOUNTER — Inpatient Hospital Stay (HOSPITAL_COMMUNITY)

## 2023-08-08 DIAGNOSIS — R8290 Unspecified abnormal findings in urine: Secondary | ICD-10-CM | POA: Insufficient documentation

## 2023-08-08 DIAGNOSIS — N179 Acute kidney failure, unspecified: Secondary | ICD-10-CM | POA: Diagnosis not present

## 2023-08-08 LAB — BASIC METABOLIC PANEL WITH GFR
Anion gap: 3 — ABNORMAL LOW (ref 5–15)
BUN: 32 mg/dL — ABNORMAL HIGH (ref 8–23)
CO2: 26 mmol/L (ref 22–32)
Calcium: 9.6 mg/dL (ref 8.9–10.3)
Chloride: 113 mmol/L — ABNORMAL HIGH (ref 98–111)
Creatinine, Ser: 1.48 mg/dL — ABNORMAL HIGH (ref 0.61–1.24)
GFR, Estimated: 44 mL/min — ABNORMAL LOW (ref >60)
Glucose, Bld: 95 mg/dL (ref 70–99)
Potassium: 4.3 mmol/L (ref 3.5–5.1)
Sodium: 142 mmol/L (ref 135–145)

## 2023-08-08 LAB — URINALYSIS, W/ REFLEX TO CULTURE (INFECTION SUSPECTED)
Bilirubin Urine: NEGATIVE
Glucose, UA: NEGATIVE mg/dL
Ketones, ur: NEGATIVE mg/dL
Nitrite: NEGATIVE
Protein, ur: 100 mg/dL — AB
Specific Gravity, Urine: 1.009 (ref 1.005–1.030)
WBC, UA: >50 WBC/hpf (ref 0–5)
pH: 6 (ref 5.0–8.0)

## 2023-08-08 LAB — T4, FREE: Free T4: 1.19 ng/dL — ABNORMAL HIGH (ref 0.61–1.12)

## 2023-08-08 MED ORDER — CEFADROXIL 500 MG PO CAPS
500.0000 mg | ORAL_CAPSULE | Freq: Two times a day (BID) | ORAL | Status: DC
  Administered 2023-08-08 – 2023-08-09 (×3): 500 mg via ORAL
  Filled 2023-08-08 (×3): qty 1

## 2023-08-08 MED ORDER — LACTATED RINGERS IV SOLN: INTRAVENOUS | Status: DC

## 2023-08-08 NOTE — Assessment & Plan Note (Addendum)
 Cr improved from 1.73 > 1.48. Baseline appears ~1.0-1.1. - Continue LR mIVF - AM CBC/BMP

## 2023-08-08 NOTE — Assessment & Plan Note (Addendum)
 UA w/ leuks, bacteruria, proteinuria, and small hemoglobinuria. Will obtain urine culture. - Cefadroxil 500 mg twice daily x 7 days (6/25 - 7/1)

## 2023-08-08 NOTE — Progress Notes (Signed)
 Daily Progress Note Intern Pager: [phone]  Patient name: Roger Briggs Medical record number: [id number] Date of birth: February 28, 1929 Age: 89 y.o. Gender: male  Primary Care Provider: Pcp, No Consultants: PT/OT/SLP, TOC Code Status: DNR/DNI  Pt Overview and Major Events to Date:  6/23: Admitted  Assessment and Plan: Roger Briggs is a 89 year old male with PMH of HTN, hyperthyroidism, A-fib not on AC d/t hx of subdural hematoma, chronic ischemic MCA infarcts, and dementia (likely vascular) presenting with physical deconditioning and AKI likely secondary to viral etiology with imaging revealed acute sinusitis vs dementia. Assessment & Plan AKI (acute kidney injury) (HCC) Cr improved from 1.73 > 1.48. Baseline appears ~1.0-1.1. - Continue LR mIVF - AM CBC/BMP Physical deconditioning Dysphagia - PT/OT/SLP consulted, appreciate recs - Fall precautions - Delirium precautions Abnormal urinalysis UA w/ leuks, bacteruria, proteinuria, and small hemoglobinuria. Will obtain urine culture. - Cefadroxil 500 mg twice daily x 7 days (6/25 - 7/1) Shortness of breath (Resolved: 08/08/2023) Off O2, saturating well on RA. Normal heart function on Echo. Chronic health problem Candida Infection: Thrush with exudate and diaper rash have been present for quite some time.  Will treat w/ Nystatin suspension and powder. Hyperthyroidism: TSH  0.210 and T4 1.19, was previously on methimazole . Will discuss results with daughter and restart Methimazole  if agreeable. HTN: Hypotensive on arrive, normotensive at this time. Will likely discontinue Lisinopril  20 mg daily at discharge. A-fib: Currently in A-fib, not on AC d/t hx of subdural hematoma. Vitamin D deficiency: Takes vitamin D 3 2000 units daily at home Vitamin B12 deficiency: Takes vitamin B12 1000 mcg daily at home Iron Deficiency Anemia: On Ferrous sulfate 325 mg at home on M/W/F  FEN/GI: Dysphagia 3 diet PPx: Lovenox  Dispo: Pending clinical  improvement  Subjective:  Patient is doing well this morning.  Resting comfortably in bed.  No concerns.  Objective: Temp:  [97.6 F (36.4 C)-98.2 F (36.8 C)] 98 F (36.7 C) (06/25 0846) Pulse Rate:  [54-71] 58 (06/25 0846) Resp:  [16-18] 16 (06/25 0354) BP: (95-123)/(60-68) 123/66 (06/25 0846) SpO2:  [97 %-99 %] 97 % (06/25 0846) Physical Exam: General: Awake and Alert in NAD HEENT: NCAT. Sclera anicteric. No rhinorrhea. Cardiovascular: RRR. No M/R/G Respiratory: CTAB, normal WOB on RA. No wheezing, crackles, rhonchi, or diminished breath sounds. Abdomen: Soft, non-tender, non-distended. Bowel sounds normoactive Extremities: Able to move all extremities. No BLE edema, no deformities or significant joint findings. Skin: Warm and dry. No abrasions or rashes noted. Neuro: A&Ox3. No focal neurological deficits.  Laboratory: Most recent CBC Lab Results  Component Value Date   WBC 5.6 08/07/2023   HGB 10.9 (L) 08/07/2023   HCT 34.6 (L) 08/07/2023   MCV 97.7 08/07/2023   PLT 113 (L) 08/07/2023   Most recent BMP    Latest Ref Rng & Units 08/08/2023    5:12 AM  BMP  Glucose 70 - 99 mg/dL 95   BUN 8 - 23 mg/dL 32   Creatinine 9.38 - 1.24 mg/dL 8.51   Sodium 864 - 854 mmol/L 142   Potassium 3.5 - 5.1 mmol/L 4.3   Chloride 98 - 111 mmol/L 113   CO2 22 - 32 mmol/L 26   Calcium 8.9 - 10.3 mg/dL 9.6    UA: Large leuks, negative nitrates, small hemoglobinuria, proteinuria (100), 0-5 non-squamous epithelium  Imaging/Diagnostic Tests: No new imaging.  Janna Ferrier, DO 08/08/2023, 12:42 PM  PGY-1, [facility] Health Family Medicine FPTS Intern pager: [phone], text pages welcome Secure chat group  [facility] Teaching Service

## 2023-08-08 NOTE — Assessment & Plan Note (Addendum)
 Off O2, saturating well on RA. Normal heart function on Echo.

## 2023-08-08 NOTE — Assessment & Plan Note (Signed)
 Candida Infection: Thrush with exudate and diaper rash have been present for quite some time.  Will treat w/ Nystatin suspension and powder. Hyperthyroidism: TSH  0.210 and T4 1.19, was previously on methimazole . Will discuss results with daughter and restart Methimazole  if agreeable. HTN: Hypotensive on arrive, normotensive at this time. Will likely discontinue Lisinopril  20 mg daily at discharge. A-fib: Currently in A-fib, not on AC d/t hx of subdural hematoma. Vitamin D deficiency: Takes vitamin D 3 2000 units daily at home Vitamin B12 deficiency: Takes vitamin B12 1000 mcg daily at home Iron Deficiency Anemia: On Ferrous sulfate 325 mg at home on M/W/F

## 2023-08-08 NOTE — Assessment & Plan Note (Addendum)
-   PT/OT/SLP consulted, appreciate recs - Fall precautions - Delirium precautions

## 2023-08-08 NOTE — Progress Notes (Signed)
 Speech Language Pathology Treatment: Dysphagia  Patient Details Name: Roger Briggs MRN: [id number] DOB: [birth year] Today's Date: 08/08/2023 Time: 8368-8357 SLP Time Calculation (min) (ACUTE ONLY): 11 min  Assessment / Plan / Recommendation Clinical Impression  Pt assessed at bedside for use of Provale cup with thin liquids. Aspiration was primarily sensed on MBS previous date and only occurred when given large sips after residue accumulated with solid trials. No signs clinically concerning for aspiration were observed when thin liquids were given via Provale cup. Since pt is not admitted with an acute etiology for dysphagia, anticipate dysphagia is chronic in nature and amount of aspiration is suspected to have been tolerated given negative CXR upon admission. Recommend continuing Dys 3 solids but with thin liquids given via 10 cc Provale cup only. SLP will continue following to reinforce education.    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  CT head and MRI without acute changes. CXR without acute changes. with a PMH of HTN, hyperthyroidism, Afib not on AC, subdural hematoma, h/o ischemia VA, dementia (not oriented to location/time at baseline per daughter)      SLP Plan  Continue with current plan of care          Recommendations  Diet recommendations: Dysphagia 3 (mechanical soft);Thin liquid (via provale cup only) Liquids provided via: Cup (via provale cup only) Medication Administration: Crushed with puree Supervision: Staff to assist with self feeding;Full supervision/cueing for compensatory strategies Compensations: Minimize environmental distractions;Slow rate;Small sips/bites Postural Changes and/or Swallow Maneuvers: Seated upright 90 degrees;Upright 30-60 min after meal                  Oral care BID   Frequent or constant Supervision/Assistance Dysphagia, oropharyngeal phase  (R13.12)     Continue with current plan of care     Damien Blumenthal, M.A., CCC-SLP Speech Language Pathology, Acute Rehabilitation Services  Secure Chat preferred [phone]   08/08/2023, 5:06 PM

## 2023-08-08 NOTE — Plan of Care (Signed)

## 2023-08-09 DIAGNOSIS — R5381 Other malaise: Secondary | ICD-10-CM | POA: Diagnosis not present

## 2023-08-09 DIAGNOSIS — N179 Acute kidney failure, unspecified: Secondary | ICD-10-CM | POA: Diagnosis not present

## 2023-08-09 LAB — CBC
HCT: 30.6 % — ABNORMAL LOW (ref 39.0–52.0)
Hemoglobin: 9.9 g/dL — ABNORMAL LOW (ref 13.0–17.0)
MCH: 31 pg (ref 26.0–34.0)
MCHC: 32.4 g/dL (ref 30.0–36.0)
MCV: 95.9 fL (ref 80.0–100.0)
Platelets: 113 10*3/uL — ABNORMAL LOW (ref 150–400)
RBC: 3.19 MIL/uL — ABNORMAL LOW (ref 4.22–5.81)
RDW: 12.4 % (ref 11.5–15.5)
WBC: 4.8 10*3/uL (ref 4.0–10.5)
nRBC: 0 % (ref 0.0–0.2)

## 2023-08-09 LAB — BASIC METABOLIC PANEL WITH GFR
Anion gap: 6 (ref 5–15)
BUN: 25 mg/dL — ABNORMAL HIGH (ref 8–23)
CO2: 25 mmol/L (ref 22–32)
Calcium: 9.6 mg/dL (ref 8.9–10.3)
Chloride: 111 mmol/L (ref 98–111)
Creatinine, Ser: 1.34 mg/dL — ABNORMAL HIGH (ref 0.61–1.24)
GFR, Estimated: 50 mL/min — ABNORMAL LOW (ref >60)
Glucose, Bld: 98 mg/dL (ref 70–99)
Potassium: 4 mmol/L (ref 3.5–5.1)
Sodium: 142 mmol/L (ref 135–145)

## 2023-08-09 LAB — URINE CULTURE

## 2023-08-09 MED ORDER — CEFADROXIL 500 MG PO CAPS: 500.0000 mg | ORAL_CAPSULE | Freq: Two times a day (BID) | ORAL | Status: AC

## 2023-08-09 MED ORDER — NYSTATIN 100000 UNIT/ML MT SUSP: 5.0000 mL | Freq: Four times a day (QID) | OROMUCOSAL | Status: AC

## 2023-08-09 NOTE — Assessment & Plan Note (Addendum)
 Notable improvement in mentation and conversation today.  Stable to discharge back to ALF. - Continue dysphagia 3 diet - Fall precautions - Delirium precautions

## 2023-08-09 NOTE — TOC Progression Note (Signed)
 Transition of Care [facility]) - Progression Note    Patient Details  Name: Roger Briggs MRN: [id number] Date of Birth: 12-Apr-1929  [facility] CM/SW Contact  Roxie KANDICE Stain, RN Phone Number: 08/09/2023, 9:16 AM  Clinical Narrative:     Beatris to Romero, daughter, who gave permission to share information with Gustav at [facility].  Romero is aware that when patient is medically ready patient will discharge back to [facility].  Expected Discharge Plan: Assisted Living Barriers to Discharge: Continued Medical Work up  Expected Discharge Plan and Services In-house Referral: Clinical Social Work Discharge Planning Services: CM Consult                               HH Arranged: Charity fundraiser, PT HH Agency: [facility] Date HH Agency Contacted: 08/07/23 Time HH Agency Contacted: 1155 Representative spoke with at [facility] Agency: [place]   Social Determinants of Health (SDOH) Interventions SDOH Screenings   Food Insecurity: No Food Insecurity (08/06/2023)  Housing: Low Risk  (08/06/2023)  Transportation Needs: No Transportation Needs (08/06/2023)  Utilities: Not At Risk (08/06/2023)  Social Connections: Socially Isolated (08/06/2023)  Tobacco Use: Medium Risk (08/06/2023)    Readmission Risk Interventions     No data to display

## 2023-08-09 NOTE — Progress Notes (Signed)
 PTAR at bedside to transport patient to [facility]. Received 7p shift report that staff were unavailable to receive report. I called [facility] again, but no answer; voicemail left for facility.

## 2023-08-09 NOTE — Discharge Summary (Signed)
 Family Medicine Teaching [facility] Discharge Summary  Patient name: Roger Briggs Medical record number: [id number] Date of birth: 1929/12/21 Age: 89 y.o. Gender: male Date of Admission: 08/06/2023  Date of Discharge: 08/09/23  Admitting Physician: Kathrine Melena, DO  Primary Care Provider: Pcp, No Consultants: PT, OT, SLP  Indication for Hospitalization: SOB and AKI 2/2 Dehydration and Deconditioning  Discharge Diagnoses/Problem List:  Principal Problem for Admission: AKI 2/2 Dehydration and Deconditioning Other Problems addressed during stay:  Principal Problem:   AKI (acute kidney injury) (HCC) Active Problems:   Physical deconditioning   Dysphagia   Shortness of breath   Anemia   Chronic health problem   Abnormal urinalysis  Brief Hospital Course:  Roger Briggs is a 89 y.o.male with a history of HTN, hyperthyroidism, dysphagia, hx of chronic A-fib not on AC d/t subdural hematoma, dementia who was admitted to the [facility] Medicine Teaching Service at [facility] for hypoxia and hypotension. His hospital course is detailed below:  Acute hypoxic respiratory failure Initially short of breath with hypoxia requiring 2L [place] upon admission.  No indication of PNA on CXR, quickly weaned to RA and otherwise well-appearing.  Shared decision making with family opted for CT chest showed likely inflammatory changes. Opted against treatment given back to baseline and not requiring additional oxygen.  AKI  Dehydration Cr elevated to 1.72 on admission, baseline appears to be ~1.0-1.1. Improved with IV fluids during admission.  Physical Deconditioning Family concern for dysphagia, decreased appetite, and increased drowsiness from baseline.  Ruled out acute intracranial pathology with CT and MRI.  PT/OT/SLP followed during admission.  Mentation and alertness improved to baseline, was started on a dysphagia 3 diet.   Candida Infection Noted to have a severe diaper rash and thrush on exam.   Treated with nystatin suspension and powder while inpatient.  Treat thrush with Nystatin suspension till 6/30 and Nystatin powder until diaper rash has resolved.  UTI UA suggestive of UTI, no prior culture data available.  Sent for culture and started treatment with cefadroxil 500 mg BID for 7 days to complete on 7/1.   Other chronic conditions were medically managed with home medications and formulary alternatives as necessary (Hyperthyroidism, HTN, A-Fib, Vit D def, Vit B12 def, IDA)  PCP Follow-up Recommendations: Consider treatment of hyperthyroidism OP s/p family discussion, family declined treatment inpatient Concern for decreased appetite - family wanted to consider starting an antidepressant such as Mirtazapine to help augment appetite or other appetite stimulants.  Discontinued Lisinopril  20 mg, Loratadine  10 mg, and Lasix  20 mg daily prior to discharge.   Results/Tests Pending at Time of Discharge:  Unresulted Labs (From admission, onward)     Start     Ordered   08/08/23 1237  Urine Culture  Once,   AD        08/08/23 1237            Disposition: ALF  Discharge Condition: Stable  Discharge Exam:  Vitals:   08/09/23 0359 08/09/23 0812  BP: 123/71 129/66  Pulse: 62 (!) 58  Resp: 16   Temp: (!) 97.5 F (36.4 C) 98 F (36.7 C)  SpO2: 100% 99%   General: Awake and Alert in NAD HEENT: NCAT. Sclera anicteric. No rhinorrhea. Thrush present. Cardiovascular: Irregularly irregular. No M/R/G Respiratory: CTAB, normal WOB on RA. No wheezing, crackles, rhonchi, or diminished breath sounds. Abdomen: Soft, non-tender, non-distended. Bowel sounds normoactive Extremities: Able to move all extremities. No BLE edema, no deformities or significant joint findings. Skin: Warm and  dry. No abrasions or rashes noted. Neuro: A&Ox3. No focal neurological deficits.  Significant Procedures: None  Significant Labs and Imaging:  Recent Labs  Lab 08/09/23 0925  WBC 4.8  HGB 9.9*   HCT 30.6*  PLT 113*   Recent Labs  Lab 08/08/23 0512 08/09/23 0925  NA 142 142  K 4.3 4.0  CL 113* 111  CO2 26 25  GLUCOSE 95 98  BUN 32* 25*  CREATININE 1.48* 1.34*  CALCIUM 9.6 9.6    CXR: No active disease  CT Head w/o Contrast: 1. No evidence of acute intracranial abnormality. 2. Chronic ischemia including an old right MCA infarct. 3. New bilateral frontal sinus fluid levels. Correlate for acute sinusitis.  MRI Brain w/o contrast 1. No acute intracranial abnormality. 2. Chronic ischemia with multiple old infarcts as above including a right MCA infarct. 3. Fluid in the frontal sinuses.  Correlate for acute sinusitis.  DG Swallow - Recommended f/u therapy  - Dysphagia 3 diet, mildly thick liquids - Meds w/ crushed with puree - Avoid jello, ice cream, thin soups, popsicles  CT Chest w/o contrast Bronchial wall thickening with associated airspace opacities in the lower lobes at the lung bases, peripheral right middle lobe and posterior left upper lobe. Findings could reflect atelectasis or bronchopneumonia.   Stable small left upper lobe pulmonary nodules compatible with benign nodules.   Diffuse 3 vessel coronary artery disease.   Aortic Atherosclerosis (ICD10-I70.0).  Discharge Medications:  Allergies as of 08/09/2023   No Known Allergies      Medication List     STOP taking these medications    furosemide  20 MG tablet Commonly known as: LASIX    lisinopril  20 MG tablet Commonly known as: ZESTRIL    loratadine  10 MG tablet Commonly known as: CLARITIN        TAKE these medications    acetaminophen  500 MG tablet Commonly known as: TYLENOL  Take 500 mg by mouth every 6 (six) hours as needed for fever or moderate pain (pain score 4-6).   Calmoseptine 0.44-20.6 % Oint Generic drug: Menthol-Zinc Oxide Apply 1 Application topically daily. To buttocks and groin   cefadroxil 500 MG capsule Commonly known as: DURICEF Take 1 capsule (500 mg total) by  mouth 2 (two) times daily for 11 doses.   CERAVE BABY MOISTRIZING CREAM EX Apply 1 application  topically in the morning and at bedtime.   CeraVe Daily Moisturizing Lotn Apply 1 Application topically See admin instructions. Apply to affected areas 2 times a day and twice a day to affected areas of the face, as needed for irritation   cyanocobalamin 1000 MCG tablet Commonly known as: VITAMIN B12 Take 1,000 mcg by mouth daily.   Ensure Original Liqd Take 237 mLs by mouth in the morning, at noon, and at bedtime.   ferrous sulfate 325 (65 FE) MG tablet Take 325 mg by mouth See admin instructions. Take 325 mg by mouth with food on Mon/Wed/Fri mornings   fluticasone 50 MCG/ACT nasal spray Commonly known as: FLONASE Place 1 spray into both nostrils daily.   magnesium oxide 400 (240 Mg) MG tablet Commonly known as: MAG-OX Take 400 mg by mouth daily.   nystatin 100000 UNIT/ML suspension Commonly known as: MYCOSTATIN Take 5 mLs (500,000 Units total) by mouth 4 (four) times daily for 5 days.   nystatin powder Generic drug: nystatin Apply 1 Application topically 2 (two) times daily. Bilateral area   traZODone  50 MG tablet Commonly known as: DESYREL  Take 25 mg by mouth at  bedtime.   Vitamin D3 50 MCG (2000 UT) capsule Take 2,000 Units by mouth daily.        Discharge Instructions: Please refer to Patient Instructions section of EMR for full details.  Patient was counseled important signs and symptoms that should prompt return to medical care, changes in medications, dietary instructions, activity restrictions, and follow up appointments.   Follow-Up Appointments:   Janna Ferrier, DO 08/09/2023, 12:33 PM PGY-1, [facility] Family Medicine

## 2023-08-09 NOTE — TOC Progression Note (Signed)
 Transition of Care [facility]) - Progression Note    Patient Details  Name: Roger Briggs MRN: [id number] Date of Birth: 09/19/1929  Transition of Care [facility]) CM/SW Contact  Roxie KANDICE Stain, RN Phone Number: 08/09/2023, 12:31 PM  Clinical Narrative:    [name] with [facility] of discharge.    Expected Discharge Plan: Assisted Living Barriers to Discharge: Continued Medical Work up  Expected Discharge Plan and Services In-house Referral: Clinical Social Work Discharge Planning Services: CM Consult     Expected Discharge Date: 08/09/23                 DME Agency: Other - Comment ([facility])       HH Arranged: RN, OT HH Agency: [facility] Date HH Agency Contacted: 08/09/23 Time HH Agency Contacted: 1231 Representative spoke with at [facility] Agency: [name]   Social Determinants of Health (SDOH) Interventions SDOH Screenings   Food Insecurity: No Food Insecurity (08/06/2023)  Housing: Low Risk  (08/06/2023)  Transportation Needs: No Transportation Needs (08/06/2023)  Utilities: Not At Risk (08/06/2023)  Social Connections: Socially Isolated (08/06/2023)  Tobacco Use: Medium Risk (08/06/2023)    Readmission Risk Interventions     No data to display

## 2023-08-09 NOTE — Assessment & Plan Note (Addendum)
 Candida Infection: Thrush with exudate has improved and diaper rash have been present for quite some time.  Will continue to treat w/ Nystatin suspension for 5 more days and powder until resolved. Hyperthyroidism: TSH  0.210 and T4 1.19, was previously on methimazole .  Cussed results with family and amenable to avoiding treatment with methimazole  due to its side effects. HTN: Hypotensive on arrive, normotensive at this time. Will likely discontinue Lisinopril  20 mg daily at discharge. A-fib: Currently in A-fib, not on AC d/t hx of subdural hematoma. Vitamin D deficiency: Takes vitamin D 3 2000 units daily at home Vitamin B12 deficiency: Takes vitamin B12 1000 mcg daily at home. Iron Deficiency Anemia: On Ferrous sulfate 325 mg at home on M/W/F

## 2023-08-09 NOTE — TOC Transition Note (Signed)
 Transition of Care [facility]) - Discharge Note   Patient Details  Name: Roger Briggs MRN: [id number] Date of Birth: 1929/06/02  Transition of Care [facility]) CM/SW Contact:  Roger SHAUNNA Cumming, LCSW Phone Number: 08/09/2023, 3:32 PM   Clinical Narrative:     CSW confirmed with RN at [facility] that pt can return. Fl2, DC summary, HH orders, and face to face faxed to facility at [phone]  Per MD patient ready for [facility] to [facility]. RN, patient, patient's family, and facility notified of DC. Discharge Summary and FL2 sent to facility. RN to call report prior to discharge ( [phone]). DC packet on chart. Ambulance transport requested for patient.   CSW will sign off for now as social work intervention is no longer needed. Please consult us  again if new needs arise.    Final next level of care: Skilled Nursing Facility Barriers to Discharge: No Barriers Identified   Patient Goals and CMS Choice Patient states their goals for this hospitalization and ongoing recovery are:: return to ALF w [facility] established          Discharge Placement                Patient to be transferred to facility by: PTAR Name of family member notified: Daughter Roger Briggs Patient and family notified of of transfer: 08/09/23  Discharge Plan and Services Additional resources added to the After Visit Summary for   In-house Referral: Clinical Social Work Discharge Planning Services: CM Consult              DME Agency: Other - Comment [employer])       HH Arranged: Charity fundraiser, OT HH Agency: [facility] Date HH Agency Contacted: 08/09/23 Time HH Agency Contacted: 1231 Representative spoke with at [facility] Agency: [place]  Social Drivers of Health (SDOH) Interventions SDOH Screenings   Food Insecurity: No Food Insecurity (08/06/2023)  Housing: Low Risk  (08/06/2023)  Transportation Needs: No Transportation Needs (08/06/2023)  Utilities: Not At Risk (08/06/2023)  Social Connections: Socially  Isolated (08/06/2023)  Tobacco Use: Medium Risk (08/06/2023)     Readmission Risk Interventions     No data to display

## 2023-08-09 NOTE — Assessment & Plan Note (Addendum)
 UA w/ leuks, bacteruria, proteinuria, and small hemoglobinuria.  Awaiting urine culture - Cefadroxil 500 mg BID x 7 days (6/25 - 7/1)

## 2023-08-09 NOTE — Progress Notes (Signed)
 Daily Progress Note Intern Pager: [phone]  Patient name: Kayan Blissett Medical record number: [id number] Date of birth: 06-Sep-1929 Age: 89 y.o. Gender: male  Primary Care Provider: Pcp, No Consultants: PT/OT/SLP, TOC Code Status: DNR/DNI  Pt Overview and Major Events to Date:  6/23: Admitted  Assessment and Plan: Yosgar Demirjian is a 89 year old male with PMH of HTN, hyperthyroidism, A-fib not on AC d/t hx of subdural hematoma, chronic ischemia MCA infarcts, and dementia (likely vascular) presenting with physical deconditioning and AKI likely secondary to viral etiology and imaging revealed acute sinusitis vs dementia.  Patient is medically stable for discharge.  Assessment & Plan AKI (acute kidney injury) (HCC) Cr improved from 1.73 > 1.48. Baseline appears ~1.0-1.1. - Discontinued LR mIVF, encourage oral hydration - AM CBC/BMP Physical deconditioning Dysphagia Notable improvement in mentation and conversation today.  Stable to discharge back to ALF. - Continue dysphagia 3 diet - Fall precautions - Delirium precautions Abnormal urinalysis UA w/ leuks, bacteruria, proteinuria, and small hemoglobinuria.  Awaiting urine culture - Cefadroxil 500 mg BID x 7 days (6/25 - 7/1) Shortness of breath Off O2, saturating well on RA.  Normal heart function on Echo.  CT chest without contrast revealed possible atelectasis vs bronchopneumonia. - Discussed with daughter and amenable to not expanding antibiotics to broaden coverage Chronic health problem Candida Infection: Thrush with exudate has improved and diaper rash have been present for quite some time.  Will continue to treat w/ Nystatin suspension for 5 more days and powder until resolved. Hyperthyroidism: TSH  0.210 and T4 1.19, was previously on methimazole .  Cussed results with family and amenable to avoiding treatment with methimazole  due to its side effects. HTN: Hypotensive on arrive, normotensive at this time. Will likely  discontinue Lisinopril  20 mg daily at discharge. A-fib: Currently in A-fib, not on AC d/t hx of subdural hematoma. Vitamin D deficiency: Takes vitamin D 3 2000 units daily at home Vitamin B12 deficiency: Takes vitamin B12 1000 mcg daily at home. Iron Deficiency Anemia: On Ferrous sulfate 325 mg at home on M/W/F  FEN/GI: Dysphagia 3 diet PPx: Lovenox  Dispo: Home  Subjective:  Patient is doing well this morning, no concerns at this time.  Would like to get out the hospital if possible.   Objective: Temp:  [97.5 F (36.4 C)-98 F (36.7 C)] 98 F (36.7 C) (06/26 0812) Pulse Rate:  [58-63] 58 (06/26 0812) Resp:  [16] 16 (06/26 0359) BP: (112-129)/(66-76) 129/66 (06/26 0812) SpO2:  [96 %-100 %] 99 % (06/26 9187) Physical Exam: General: Awake and Alert in NAD HEENT: NCAT. Sclera anicteric. No rhinorrhea. Cardiovascular: RRR. No M/R/G Respiratory: CTAB, normal WOB on RA. No wheezing, crackles, rhonchi, or diminished breath sounds. Abdomen: Soft, non-tender, non-distended. Bowel sounds normoactive Extremities: Able to move all extremities. No BLE edema, no deformities or significant joint findings. Skin: Warm and dry. No abrasions or rashes noted. Neuro: A&Ox2 (name, location). No focal neurological deficits.  Laboratory: Most recent CBC Lab Results  Component Value Date   WBC 4.8 08/09/2023   HGB 9.9 (L) 08/09/2023   HCT 30.6 (L) 08/09/2023   MCV 95.9 08/09/2023   PLT 113 (L) 08/09/2023   Most recent BMP    Latest Ref Rng & Units 08/09/2023    9:25 AM  BMP  Glucose 70 - 99 mg/dL 98   BUN 8 - 23 mg/dL 25   Creatinine 9.38 - 1.24 mg/dL 8.65   Sodium 864 - 854 mmol/L 142   Potassium 3.5 -  5.1 mmol/L 4.0   Chloride 98 - 111 mmol/L 111   CO2 22 - 32 mmol/L 25   Calcium 8.9 - 10.3 mg/dL 9.6    Imaging/Diagnostic Tests: CT Chest w/o contrast Bronchial wall thickening with associated airspace opacities in the lower lobes at the lung bases, peripheral right middle lobe and  posterior left upper lobe. Findings could reflect atelectasis or bronchopneumonia.  Stable small left upper lobe pulmonary nodules compatible with benign nodules.  Diffuse 3 vessel coronary artery disease.  Aortic Atherosclerosis (ICD10-I70.0).  Janna Ferrier, DO 08/09/2023, 12:19 PM  PGY-1, [facility] Family Medicine FPTS Intern pager: [phone], text pages welcome Secure chat group [facility] Teaching Service

## 2023-08-09 NOTE — Progress Notes (Signed)
 Physical Therapy Treatment Patient Details Name: Roger Briggs MRN: [id number] DOB: [birth year] Today's Date: 08/09/2023   History of Present Illness Roger Briggs is a 89 yo M who presents from ALF with weakness, decreased PO intake over the weekend, and found to have AKI.  CT head and MRI without acute changes. PMH: HTN, hyperthyroidism, Afib not on AC, subdural hematoma, h/o ischemia VA, dementia (not oriented to location/time at baseline per daughter)    PT Comments  Pt alert and eager to mobilize. Pt able to amb 100' with RW and min/modA however was incontinent of stool during ambulation and was unaware. Pt dependent for hygiene. Pt continues to progress towards all goals. Acute PT to cont to follow.    If plan is discharge home, recommend the following: A little help with walking and/or transfers;A lot of help with bathing/dressing/bathroom;Assist for transportation;Supervision due to cognitive status   Can travel by private vehicle        Equipment Recommendations  None recommended by PT    Recommendations for Other Services       Precautions / Restrictions Precautions Precautions: Fall Recall of Precautions/Restrictions: Impaired Restrictions Weight Bearing Restrictions Per Provider Order: No     Mobility  Bed Mobility Overal bed mobility: Needs Assistance Bed Mobility: Supine to Sit, Sit to Supine     Supine to sit: Mod assist Sit to supine: Mod assist   General bed mobility comments: HOB elevated, modA for trunk elevation and to scoot to EOB, assist for LE assist back into bed    Transfers Overall transfer level: Needs assistance Equipment used: Rolling walker (2 wheels) Transfers: Sit to/from Stand, Bed to chair/wheelchair/BSC Sit to Stand: Mod assist           General transfer comment: bed elevated, increased time, modA to power up, completed 4 total sit to stands    Ambulation/Gait Ambulation/Gait assistance: Mod assist Gait Distance (Feet): 100  Feet Assistive device: Rolling walker (2 wheels) Gait Pattern/deviations: Decreased stride length, Step-to pattern, Shuffle, Trunk flexed, Narrow base of support Gait velocity: dec Gait velocity interpretation: <1.31 ft/sec, indicative of household ambulator   General Gait Details: pt with short step height and length, near fenestating/shuffling gait pattern,initially minA progressing to modA with onset of fatigue. Pt with narrow base of support and progressive trunk flexion, pt with stool incontinence down the hall and unaware. rehab tech to aide in clean up   Stairs             Wheelchair Mobility     Tilt Bed    Modified Rankin (Stroke Patients Only)       Balance Overall balance assessment: Needs assistance Sitting-balance support: No upper extremity supported, Feet supported Sitting balance-Leahy Scale: Fair Sitting balance - Comments: able to sit EOB, slight posterior lean, able to self correct sitting balance   Standing balance support: Bilateral upper extremity supported, During functional activity, Reliant on assistive device for balance Standing balance-Leahy Scale: Poor Standing balance comment: reliant on RW for support                            Communication Communication Communication: Impaired Factors Affecting Communication: Hearing impaired  Cognition Arousal: Alert Behavior During Therapy: WFL for tasks assessed/performed   PT - Cognitive impairments: History of cognitive impairments (known dementia)                       PT - Cognition Comments: decreased  insight to incontinence of stool Following commands: Intact      Cueing Cueing Techniques: Verbal cues  Exercises      General Comments General comments (skin integrity, edema, etc.): dependent for hygiene s/p BM, noted rash/irritation on back      Pertinent Vitals/Pain Pain Assessment Pain Assessment: No/denies pain    Home Living                           Prior Function            PT Goals (current goals can now be found in the care plan section) Acute Rehab PT Goals Patient Stated Goal: didn't state PT Goal Formulation: With patient/family Time For Goal Achievement: 08/21/23 Potential to Achieve Goals: Good Progress towards PT goals: Progressing toward goals    Frequency    Min 2X/week      PT Plan      Co-evaluation              AM-PAC PT 6 Clicks Mobility   Outcome Measure  Help needed turning from your back to your side while in a flat bed without using bedrails?: A Lot Help needed moving from lying on your back to sitting on the side of a flat bed without using bedrails?: A Lot Help needed moving to and from a bed to a chair (including a wheelchair)?: A Lot Help needed standing up from a chair using your arms (e.g., wheelchair or bedside chair)?: A Lot Help needed to walk in hospital room?: A Lot Help needed climbing 3-5 steps with a railing? : A Lot 6 Click Score: 12    End of Session Equipment Utilized During Treatment: Gait belt Activity Tolerance: Patient tolerated treatment well Patient left: with call bell/phone within reach;with nursing/sitter in room;in bed;with bed alarm set Nurse Communication: Mobility status PT Visit Diagnosis: Unsteadiness on feet (R26.81);Muscle weakness (generalized) (M62.81);Difficulty in walking, not elsewhere classified (R26.2)     Time: 0940-1010 PT Time Calculation (min) (ACUTE ONLY): 30 min  Charges:    $Gait Training: 8-22 mins $Therapeutic Activity: 8-22 mins PT General Charges $$ ACUTE PT VISIT: 1 Visit                     Norene Ames, PT, DPT Acute Rehabilitation Services Secure chat preferred Office #: [phone]    Norene CHRISTELLA Ames 08/09/2023, 10:18 AM

## 2023-08-09 NOTE — Assessment & Plan Note (Addendum)
 Off O2, saturating well on RA.  Normal heart function on Echo.  CT chest without contrast revealed possible atelectasis vs bronchopneumonia. - Discussed with daughter and amenable to not expanding antibiotics to broaden coverage

## 2023-08-09 NOTE — Assessment & Plan Note (Addendum)
 Cr improved from 1.73 > 1.48. Baseline appears ~1.0-1.1. - Discontinued LR mIVF, encourage oral hydration - AM CBC/BMP

## 2023-08-09 NOTE — NC FL2 (Addendum)
   MEDICAID FL2 LEVEL OF CARE FORM     IDENTIFICATION  Patient Name: Roger Briggs Birthdate: 1929/07/25 Sex: male Admission Date (Current Location): 08/06/2023  [facility] and [place] Number:  Producer, television/film/video and Address:  [facility] [place], [address] [place], [place] [address]      Provider Number: [id number]  Attending Physician Name and Address:  Camie Mulch MD  Relative Name and Phone Number:  Sharlet Saba; Daughter; [phone]    Current Level of Care: Hospital Recommended Level of Care: Assisted Living Facility (ALF with [facility]) Prior Approval Number:    Date Approved/Denied:   PASRR Number:    Discharge Plan: Other (Comment) (ALF with [facility])    Current Diagnoses: Patient Active Problem List   Diagnosis Date Noted   AKI (acute kidney injury) (HCC) 08/06/2023   Physical deconditioning 08/06/2023   Dysphagia 08/06/2023   Shortness of breath 08/06/2023   Anemia 08/06/2023   Chronic health problem 08/06/2023   Thrombocytopenia (HCC) 07/23/2021   CAP (community acquired pneumonia) 07/22/2021   Sinus bradycardia 07/22/2021   Dementia without behavioral disturbance (HCC) 07/22/2021   Essential hypertension 07/22/2021   Thoracic aortic aneurysm (HCC) 07/22/2021   DNR (do not resuscitate) 07/22/2021   Hyperthyroidism 12/10/2017    Orientation RESPIRATION BLADDER Height & Weight     Self  Normal (Room Air) Incontinent Weight: 182 lb (82.6 kg) Height:  6' 1 (185.4 cm)  BEHAVIORAL SYMPTOMS/MOOD NEUROLOGICAL BOWEL NUTRITION STATUS      Continent Mechanical soft  AMBULATORY STATUS COMMUNICATION OF NEEDS Skin   Limited Assist Verbally Normal                       Personal Care Assistance Level of Assistance  Bathing, Feeding, Dressing Bathing Assistance: Limited assistance Feeding assistance: Limited assistance Dressing Assistance: Limited assistance     Functional Limitations Info             SPECIAL CARE FACTORS  FREQUENCY  PT (By licensed PT), OT (By licensed OT), Speech therapy     PT Frequency: 2x OT Frequency: 2x          Contractures Contractures Info: Not present    Additional Factors Info  Code Status, Allergies Code Status Info: DNR-LIMITED -Do Not Intubate/DNI Allergies Info: NKA             Discharge Medications: STOP taking these medications     furosemide  20 MG tablet Commonly known as: LASIX     lisinopril  20 MG tablet Commonly known as: ZESTRIL     loratadine  10 MG tablet Commonly known as: CLARITIN            TAKE these medications     acetaminophen  500 MG tablet Commonly known as: TYLENOL  Take 500 mg by mouth every 6 (six) hours as needed for fever or moderate pain (pain score 4-6).    Calmoseptine 0.44-20.6 % Oint Generic drug: Menthol-Zinc Oxide Apply 1 Application topically daily. To buttocks and groin    cefadroxil 500 MG capsule Commonly known as: DURICEF Take 1 capsule (500 mg total) by mouth 2 (two) times daily for 11 doses.    CERAVE BABY MOISTRIZING CREAM EX Apply 1 application  topically in the morning and at bedtime.    CeraVe Daily Moisturizing Lotn Apply 1 Application topically See admin instructions. Apply to affected areas 2 times a day and twice a day to affected areas of the face, as needed for irritation    cyanocobalamin 1000  MCG tablet Commonly known as: VITAMIN B12 Take 1,000 mcg by mouth daily.    Ensure Original Liqd Take 237 mLs by mouth in the morning, at noon, and at bedtime.    ferrous sulfate 325 (65 FE) MG tablet Take 325 mg by mouth See admin instructions. Take 325 mg by mouth with food on Mon/Wed/Fri mornings    fluticasone 50 MCG/ACT nasal spray Commonly known as: FLONASE Place 1 spray into both nostrils daily.    magnesium oxide 400 (240 Mg) MG tablet Commonly known as: MAG-OX Take 400 mg by mouth daily.    nystatin 100000 UNIT/ML suspension Commonly known as: MYCOSTATIN Take 5 mLs (500,000 Units total)  by mouth 4 (four) times daily for 5 days.    nystatin powder Generic drug: nystatin Apply 1 Application topically 2 (two) times daily. Bilateral area    traZODone  50 MG tablet Commonly known as: DESYREL  Take 25 mg by mouth at bedtime.    Vitamin D3 50 MCG (2000 UT) capsule Take 2,000 Units by mouth daily.    Relevant Imaging Results:  Relevant Lab Results:   Additional Information SSN: [id number] Luann SHAUNNA Cumming, LCSW

## 2023-08-23 ENCOUNTER — Emergency Department (HOSPITAL_COMMUNITY)

## 2023-08-23 ENCOUNTER — Emergency Department (HOSPITAL_COMMUNITY)
Admission: EM | Admit: 2023-08-23 | Discharge: 2023-08-24 | Disposition: A | Discharge status: Skilled Nursing Facility | Attending: Emergency Medicine | Admitting: Emergency Medicine

## 2023-08-23 ENCOUNTER — Other Ambulatory Visit: Payer: Self-pay

## 2023-08-23 DIAGNOSIS — R531 Weakness: Secondary | ICD-10-CM | POA: Insufficient documentation

## 2023-08-23 DIAGNOSIS — R5383 Other fatigue: Secondary | ICD-10-CM | POA: Diagnosis present

## 2023-08-23 DIAGNOSIS — F0390 Unspecified dementia without behavioral disturbance: Secondary | ICD-10-CM | POA: Insufficient documentation

## 2023-08-23 DIAGNOSIS — D649 Anemia, unspecified: Secondary | ICD-10-CM

## 2023-08-23 LAB — URINALYSIS, ROUTINE W REFLEX MICROSCOPIC
Bacteria, UA: NONE SEEN
Bilirubin Urine: NEGATIVE
Glucose, UA: NEGATIVE mg/dL
Ketones, ur: NEGATIVE mg/dL
Nitrite: NEGATIVE
Protein, ur: 100 mg/dL — AB
RBC / HPF: >50 RBC/hpf (ref 0–5)
Specific Gravity, Urine: 1.009 (ref 1.005–1.030)
WBC, UA: >50 WBC/hpf (ref 0–5)
pH: 6 (ref 5.0–8.0)

## 2023-08-23 LAB — COMPREHENSIVE METABOLIC PANEL WITH GFR
ALT: 8 U/L (ref 0–44)
AST: 10 U/L — ABNORMAL LOW (ref 15–41)
Albumin: 1.7 g/dL — ABNORMAL LOW (ref 3.5–5.0)
Alkaline Phosphatase: 40 U/L (ref 38–126)
Anion gap: 7 (ref 5–15)
BUN: 21 mg/dL (ref 8–23)
CO2: 22 mmol/L (ref 22–32)
Calcium: 8.3 mg/dL — ABNORMAL LOW (ref 8.9–10.3)
Chloride: 108 mmol/L (ref 98–111)
Creatinine, Ser: 1.41 mg/dL — ABNORMAL HIGH (ref 0.61–1.24)
GFR, Estimated: 47 mL/min — ABNORMAL LOW (ref >60)
Glucose, Bld: 104 mg/dL — ABNORMAL HIGH (ref 70–99)
Potassium: 3.6 mmol/L (ref 3.5–5.1)
Sodium: 137 mmol/L (ref 135–145)
Total Bilirubin: 0.6 mg/dL (ref 0.0–1.2)
Total Protein: 5 g/dL — ABNORMAL LOW (ref 6.5–8.1)

## 2023-08-23 LAB — CBC WITH DIFFERENTIAL/PLATELET
Abs Immature Granulocytes: 0.02 K/uL (ref 0.00–0.07)
Basophils Absolute: 0 K/uL (ref 0.0–0.1)
Basophils Relative: 1 %
Eosinophils Absolute: 0.1 K/uL (ref 0.0–0.5)
Eosinophils Relative: 1 %
HCT: 27 % — ABNORMAL LOW (ref 39.0–52.0)
Hemoglobin: 8.7 g/dL — ABNORMAL LOW (ref 13.0–17.0)
Immature Granulocytes: 0 %
Lymphocytes Relative: 23 %
Lymphs Abs: 1.3 K/uL (ref 0.7–4.0)
MCH: 31.4 pg (ref 26.0–34.0)
MCHC: 32.2 g/dL (ref 30.0–36.0)
MCV: 97.5 fL (ref 80.0–100.0)
Monocytes Absolute: 0.5 K/uL (ref 0.1–1.0)
Monocytes Relative: 8 %
Neutro Abs: 3.9 K/uL (ref 1.7–7.7)
Neutrophils Relative %: 67 %
Platelets: 115 K/uL — ABNORMAL LOW (ref 150–400)
RBC: 2.77 MIL/uL — ABNORMAL LOW (ref 4.22–5.81)
RDW: 12.6 % (ref 11.5–15.5)
WBC: 5.7 K/uL (ref 4.0–10.5)
nRBC: 0 % (ref 0.0–0.2)

## 2023-08-23 LAB — POC OCCULT BLOOD, ED: Fecal Occult Bld: NEGATIVE

## 2023-08-23 MED ORDER — SODIUM CHLORIDE 0.9 % IV BOLUS
500.0000 mL | Freq: Once | INTRAVENOUS | Status: AC
  Administered 2023-08-23: 500 mL via INTRAVENOUS

## 2023-08-23 NOTE — ED Provider Notes (Signed)
 [facility] Provider Note   CSN: [id number] Arrival date & time: 08/23/23  1850     Patient presents with: Fatigue   Roger Briggs is a 89 y.o. male.   HPI   89 year old male presents emergency department from [facility] with concern for increased lethargy, confusion.  Patient is alert and oriented x 2 which is baseline for him, history of dementia.  Family member at bedside said they went to visit him today and he was still in bed.  They reported that he has not had anything to eat in 2 days which is unusual for him.  The patient himself has no specific complaint outside of generalized weakness.  Report from EMS is that he was initially hypotensive but otherwise normal vitals.  Prior to Admission medications   Medication Sig Start Date End Date Taking? Authorizing Provider  acetaminophen  (TYLENOL ) 500 MG tablet Take 500 mg by mouth every 6 (six) hours as needed for fever or moderate pain (pain score 4-6).    [provider]  Cholecalciferol (VITAMIN D3) 50 MCG (2000 UT) capsule Take 2,000 Units by mouth daily.    [provider]  Emollient (CERAVE DAILY MOISTURIZING) LOTN Apply 1 Application topically See admin instructions. Apply to affected areas 2 times a day and twice a day to affected areas of the face, as needed for irritation 08/21/19   [provider]  ferrous sulfate 325 (65 FE) MG tablet Take 325 mg by mouth See admin instructions. Take 325 mg by mouth with food on Mon/Wed/Fri mornings    [provider]  fluticasone (FLONASE) 50 MCG/ACT nasal spray Place 1 spray into both nostrils daily.    [provider]  Infant Care Products (CERAVE BABY MOISTRIZING CREAM EX) Apply 1 application  topically in the morning and at bedtime.    [provider]  magnesium oxide (MAG-OX) 400 (240 Mg) MG tablet Take 400 mg by mouth daily.    [provider]  Menthol-Zinc  Oxide (CALMOSEPTINE) 0.44-20.6 % OINT Apply 1 Application topically daily. To buttocks and groin    [provider]  Nutritional Supplements (ENSURE ORIGINAL) LIQD Take 237 mLs by mouth in the morning, at noon, and at bedtime.    [provider]  NYSTATIN  powder Apply 1 Application topically 2 (two) times daily. Bilateral area 07/19/23   [provider]  traZODone  (DESYREL ) 50 MG tablet Take 25 mg by mouth at bedtime.     [provider]  vitamin B-12 (CYANOCOBALAMIN) 1000 MCG tablet Take 1,000 mcg by mouth daily. 10/23/20   [provider]    Allergies: Patient has no known allergies.    Review of Systems  Constitutional:  Positive for appetite change and fatigue. Negative for fever.  Respiratory:  Negative for shortness of breath.   Cardiovascular:  Negative for chest pain.  Gastrointestinal:  Negative for abdominal pain, diarrhea and vomiting.  Skin:  Negative for rash.  Neurological:  Negative for headaches.    Updated Vital Signs BP 106/60 (BP Location: Right Arm)   Pulse (!) 55   Temp 98.2 F (36.8 C) (Oral)   Resp 20   SpO2 100%   Physical Exam Vitals and nursing note reviewed.  Constitutional:      General: He is not in acute distress.    Appearance: Normal appearance.  HENT:     Head: Normocephalic.     Mouth/Throat:     Mouth: Mucous membranes are  moist.  Cardiovascular:     Rate and Rhythm: Normal rate.  Pulmonary:     Effort: Pulmonary effort is normal. No respiratory distress.  Abdominal:     Palpations: Abdomen is soft.     Tenderness: There is no abdominal tenderness.  Musculoskeletal:        General: No swelling.  Skin:    General: Skin is warm.  Neurological:     Mental Status: He is alert. Mental status is at baseline.  Psychiatric:        Mood and Affect: Mood normal.     (all labs ordered are listed, but only abnormal results are displayed) Labs Reviewed  CBC WITH DIFFERENTIAL/PLATELET - Abnormal;  Notable for the following components:      Result Value   RBC 2.77 (*)    Hemoglobin 8.7 (*)    HCT 27.0 (*)    Platelets 115 (*)    All other components within normal limits  COMPREHENSIVE METABOLIC PANEL WITH GFR  URINALYSIS, ROUTINE W REFLEX MICROSCOPIC    EKG: None  Radiology: [facility] Chest Port 1 View Result Date: 08/23/2023 CLINICAL DATA:  Weakness. EXAM: PORTABLE CHEST 1 VIEW COMPARISON:  August 06, 2023. FINDINGS: Stable cardiomediastinal silhouette. Minimal bibasilar subsegmental atelectasis is noted. Bony thorax is unremarkable. IMPRESSION: Minimal bibasilar subsegmental atelectasis. Electronically Signed   By: Lynwood Landy Raddle M.D.   On: 08/23/2023 20:56     Procedures   Medications Ordered in the ED - No data to display                                  Medical Decision Making Amount and/or Complexity of Data Reviewed Labs: ordered. Radiology: ordered.   89 year old male presents emergency department concern for increased weakness.  Patient himself has no acute complaint.  Vitals are normal and stable.  Daughter is at bedside and states that he has had a decline over the past couple days.  Recent UTI with antibiotic usage.  Blood work shows a downtrending hemoglobin.  Appears at baseline is more around 11, today he is 8.7, this has been slowly downtrending over the past couple weeks.  Bedside occult is negative.  Recommended outpatient workup for this as this may be contributing to his weakness.  Otherwise lab evaluation is baseline for the patient.  Urinalysis seems to be a contaminated specimen with squamous cells.  It is nitrate negative, but does have greater than 50 white blood cells.  Will hold antibiosis at this time and send for urine culture.  Patient does seem asymptomatic but has dementia and wears depends.  Chest x-ray is unremarkable.  Patient is reassuring, nonfocal exam and again has no acute complaints.  Patient at this time appears safe and stable for  discharge and close outpatient follow up. Discharge plan and strict return to ED precautions discussed, patient verbalizes understanding and agreement.     Final diagnoses:  None    ED Discharge Orders     None          Bari Roxie HERO, DO 08/24/23 0005

## 2023-08-23 NOTE — ED Triage Notes (Signed)
 Pt bib [facility] coming from [facility]. EMS states they were called out due to patient increasing lethargy and decreased activity for a few days. Pt has non-productive cough. Pt alert and oriented x 2 (self and location) which is his baseline, per EMS. Pt denies pain at this time. EMS states patient was initially hypotensive and and met their sepsis criteria.   EMS VS: 108/68 60-80 irregular/a-fib 98.4 temp 135 cbg 800 LR

## 2023-08-24 MED ORDER — POLYMYXIN B-TRIMETHOPRIM 10000-0.1 UNIT/ML-% OP SOLN: 2.0000 [drp] | OPHTHALMIC | 0 refills | Status: AC

## 2023-08-24 NOTE — Discharge Instructions (Signed)
 You have been seen and discharged from the emergency department.  Your blood work shows anemia.  Your stool exam showed no bleeding.  This needs to be worked up as an outpatient and may be a contributing factor to your weakness.  Your urinalysis did not look like an infection but a urine culture was sent.  Follow-up with your primary provider for further evaluation and further care. Take home medications as prescribed. If you have any worsening symptoms or further concerns for your health please return to an emergency department for further evaluation.

## 2023-08-26 LAB — URINE CULTURE: Culture: >=100000 — AB

## 2023-08-27 ENCOUNTER — Telehealth (HOSPITAL_BASED_OUTPATIENT_CLINIC_OR_DEPARTMENT_OTHER): Payer: Self-pay | Admitting: *Deleted

## 2023-08-27 NOTE — Telephone Encounter (Signed)
 Post ED Visit - Positive Culture Follow-up  Culture report reviewed by antimicrobial stewardship pharmacist: [facility] [x]  Koren Or, Pharm.D. []  Venetia Gully, Pharm.D., BCPS AQ-ID []  Garrel Crews, Pharm.D., BCPS []  Almarie Lunger, Pharm.D., BCPS []  Catherine, [address].D., BCPS, AAHIVP []  Rosaline Bihari, Pharm.D., BCPS, AAHIVP []  Vernell Meier, PharmD, BCPS []  Latanya Hint, PharmD, BCPS []  Donald Medley, PharmD, BCPS []  Rocky Bold, PharmD []  Dorothyann Alert, PharmD, BCPS []  Morene Babe, PharmD  [facility] []  Rosaline Edison, PharmD []  Romona Bliss, PharmD []  Dolphus Roller, PharmD []  Veva Seip, Rph []  Vernell Daunt) Leonce, PharmD []  Eva Allis, PharmD []  Rosaline Millet, PharmD []  Iantha Batch, PharmD []  Arvin Gauss, PharmD []  Wanda Hasting, PharmD []  Ronal Rav, PharmD []  Rocky Slade, PharmD []  Bard Jeans, PharmD   Positive urine culture C/o increased lethargy and confusion.  U/A turbid, moderate leukocytes.  Pt did not c/o urinary sx; no further patient follow-up is required at this time.  Lorita Barnie Pereyra 08/27/2023, 12:09 PM

## 2023-11-14 DEATH — deceased
